# Patient Record
Sex: Male | Born: 1985 | Race: White | Hispanic: No | Marital: Married | Smoking: Never smoker
Health system: Southern US, Community
[De-identification: ages and names within clinical notes are randomized; demographics above are authoritative.]

## PROBLEM LIST (undated history)

## (undated) DIAGNOSIS — C9591 Leukemia, unspecified, in remission: Secondary | ICD-10-CM

## (undated) DIAGNOSIS — G4730 Sleep apnea, unspecified: Secondary | ICD-10-CM

## (undated) DIAGNOSIS — F909 Attention-deficit hyperactivity disorder, unspecified type: Secondary | ICD-10-CM

## (undated) HISTORY — PX: JOINT REPLACEMENT: SHX530

## (undated) HISTORY — PX: BONE GRAFT HIP ILIAC CREST: SUR159

## (undated) HISTORY — DX: Leukemia, unspecified, in remission: C95.91

## (undated) HISTORY — DX: Sleep apnea, unspecified: G47.30

---
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
## 2008-02-29 ENCOUNTER — Emergency Department (HOSPITAL_COMMUNITY): Admission: EM | Admit: 2008-02-29 | Discharge: 2008-02-29 | Payer: Self-pay | Admitting: Emergency Medicine

## 2010-04-15 ENCOUNTER — Encounter: Payer: Self-pay | Admitting: Family Medicine

## 2010-07-06 ENCOUNTER — Ambulatory Visit
Admission: RE | Admit: 2010-07-06 | Discharge: 2010-07-06 | Disposition: A | Discharge status: Home / Self Care | Payer: BC Managed Care – PPO | Source: Ambulatory Visit | Attending: Family Medicine | Admitting: Family Medicine

## 2010-07-06 ENCOUNTER — Other Ambulatory Visit: Payer: Self-pay | Admitting: Family Medicine

## 2010-07-06 DIAGNOSIS — M25551 Pain in right hip: Secondary | ICD-10-CM

## 2010-08-10 NOTE — Procedures (Signed)
NAME:  Patrick Cohen, Patrick Cohen             ACCOUNT NO.:  [id number]   MEDICAL RECORD NO.:  [id number]          PATIENT TYPE:  OUT   LOCATION:  SLEEP CENTER                 FACILITY:  [facility]   PHYSICIAN:  Clinton D. Maple Hudson, M.D. DATE OF BIRTH:  [birth year]   DATE OF ADMISSION:  09/27/2003  DATE OF DISCHARGE:  09/27/2003                            MULTIPLE SLEEP LATENCY TEST   REFERRING PHYSICIAN:  Dr. Jetty Duhamel.   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  NPSG on  September 27, 2003 recorded an RDI of 15 obstructive events per hour consistent  with mild to moderate obstructive sleep apnea.  Sleep architecture was  significant for 321 minutes of sleep, including 9% REM and a sleep  efficiency of 66%.  A sleep latency of 146 minutes raised possibility of  delayed sleep phase syndrome.  Epworth sleepiness score prior to the NPSG  was 19/24 and no medications were taken during the study day of the MSLT.   MEDICATIONS:  Wellbutrin which he had not taken in a month, and Strattera  last taken on May 30.   SLEEP ARCHITECTURE:  Five naps were recorded at 2 hour intervals, beginning  at 8 a.m.  Nap Times   Sleep Latency                             REM Latency  1.  8am                       5 mins.                                   0  2.  10am                      1.5 mins.                           0  3.  12pm                      .                                   0  4.  2pm                       20 mins.                                  0  5.  4pm                       15.72mins.  0   Mean Sleep Latency:  10.6  minutes, no REM was recorded..   IMPRESSION/RECOMMENDATION:  Mild nonspecific somnolence, within normal for a  young adult, noting a preponderance of sleepiness on the 8 a.m. and 10 a.m.  naps.  This may be consistent with his recorded sleep problems from the  NPSG.  There is  no evidence to suggest narcolepsy.                                   ______________________________                                Rennis Chris Maple Hudson, M.D.                                Diplomate, American Board of Sleep Medicine    CDY/MEDQ  D:  10/09/2003 10:13:03  T:  10/09/2003 20:26:13  Job:  [id number]

## 2010-08-10 NOTE — Procedures (Signed)
NAME:  Patrick Cohen, Patrick Cohen             ACCOUNT NO.:  [id number]   MEDICAL RECORD NO.:  [id number]          PATIENT TYPE:  OUT   LOCATION:  SLEEP CENTER                 FACILITY:  [facility]   PHYSICIAN:  Clinton D. Maple Hudson, M.D. DATE OF BIRTH:  03-07-1986   DATE OF ADMISSION:  09/27/2003  DATE OF DISCHARGE:  09/27/2003                              NOCTURNAL POLYSOMNOGRAM   REFERRING PHYSICIAN:  Dr. Jetty Duhamel   INDICATION FOR STUDY/HISTORY:  24 year old male complaining of  insomnia/sleep apnea.  Difficulty initiating sleep, excessive daytime  sleepiness, frequent arousals.  BMI 23, weight 147 pounds, Epworth score  16/24, neck size 15 inches.  No medication was taken before this sleep study  and the patient has not taken Wellbutrin in the month prior to this study.   MEDICATIONS:  Wellbutrin and Strattera.   SLEEP ARCHITECTURE:  After lights out at 9:56 p.m. sleep onset was at 12:23  a.m., awakening at 6 a.m. for a total sleep time of 321.5 minutes.  Sleep  efficiency was 95% once sleep was achieved but sleep efficiency for  recording time was 66%.  Stage 1 2%, stage 2 80%, stage 3 and 4 8%, REM was  9%.  Sleep latency 146 minutes, REM latency 130 minutes.  Arousal index was  4.9 per hour.  Most sleep was supine or on right side.   RESPIRATORY DATA:  Following NPS protocol, mild obstructive sleep  apnea/hypopnea was recorded with an RDI of 15.5 obstructive events per hour.  This included 48 obstructive hypopneas, 35 obstructive apneas.  Events were  recorded while supine and sleeping on the right side.   OXYGEN DATA:  Snoring was extremely loud.  Mean oxygen saturation through  the study was 95-96% with a nadir to 83% recorded during REM, briefly.   CARDIAC DATA:  Normal sinus rhythm with occasional PACs.  Movement/parasomnia:  Occasional limb jerks were not associated with  arousals.  PLM index was 0 per hour.  There were no bathroom trips.   IMPRESSION/RECOMMENDATION:  1. Somewhat  delayed sleep onset might suggest a component of delayed sleep     phase syndrome in this age range.  2. Mild obstructive sleep apnea/hypopnea with mild oxygen desaturation and     loud snoring.  3. See the MSLT report.                                   ______________________________                                Rennis Chris. Maple Hudson, M.D.                                Diplomate, American Board of Sleep Medicine    CDY/MEDQ  D:  10/09/2003 10:03:03  T:  10/09/2003 20:15:46  Job:  [id number]

## 2010-10-02 ENCOUNTER — Other Ambulatory Visit: Payer: Self-pay | Admitting: Family Medicine

## 2010-10-02 DIAGNOSIS — M25551 Pain in right hip: Secondary | ICD-10-CM

## 2010-10-06 ENCOUNTER — Ambulatory Visit
Admission: RE | Admit: 2010-10-06 | Discharge: 2010-10-06 | Disposition: A | Discharge status: Home / Self Care | Payer: BC Managed Care – PPO | Source: Ambulatory Visit | Attending: Family Medicine | Admitting: Family Medicine

## 2010-10-06 DIAGNOSIS — M25551 Pain in right hip: Secondary | ICD-10-CM

## 2010-10-06 MED ORDER — GADOBENATE DIMEGLUMINE 529 MG/ML IV SOLN
13.0000 mL | Freq: Once | INTRAVENOUS | Status: AC | PRN
  Administered 2010-10-06: 13 mL via INTRAVENOUS

## 2010-12-28 LAB — CBC
HCT: 26.4 % — ABNORMAL LOW (ref 39.0–52.0)
Hemoglobin: 9 g/dL — ABNORMAL LOW (ref 13.0–17.0)
MCHC: 34.2 g/dL (ref 30.0–36.0)
MCV: 93.9 fL (ref 78.0–100.0)
Platelets: 216 10*3/uL (ref 150–400)
RBC: 2.82 MIL/uL — ABNORMAL LOW (ref 4.22–5.81)
RDW: 15.7 % — ABNORMAL HIGH (ref 11.5–15.5)
WBC: 2.6 10*3/uL — ABNORMAL LOW (ref 4.0–10.5)

## 2010-12-28 LAB — APTT: aPTT: 27 seconds (ref 24–37)

## 2010-12-28 LAB — DIFFERENTIAL
Lymphocytes Relative: 55 % — ABNORMAL HIGH (ref 12–46)
Lymphs Abs: 1.4 10*3/uL (ref 0.7–4.0)
Monocytes Relative: 10 % (ref 3–12)
Neutro Abs: 0.9 10*3/uL — ABNORMAL LOW (ref 1.7–7.7)
Neutrophils Relative %: 34 % — ABNORMAL LOW (ref 43–77)

## 2010-12-28 LAB — PROTIME-INR: INR: 1.2 (ref 0.00–1.49)

## 2011-08-16 ENCOUNTER — Ambulatory Visit
Admission: RE | Admit: 2011-08-16 | Discharge: 2011-08-16 | Disposition: A | Discharge status: Home / Self Care | Payer: Self-pay | Source: Ambulatory Visit | Attending: Family Medicine | Admitting: Family Medicine

## 2011-08-16 ENCOUNTER — Other Ambulatory Visit: Payer: Self-pay | Admitting: Family Medicine

## 2011-08-16 DIAGNOSIS — M25519 Pain in unspecified shoulder: Secondary | ICD-10-CM

## 2011-08-16 DIAGNOSIS — M8700 Idiopathic aseptic necrosis of unspecified bone: Secondary | ICD-10-CM

## 2011-10-11 ENCOUNTER — Ambulatory Visit
Admission: RE | Admit: 2011-10-11 | Discharge: 2011-10-11 | Disposition: A | Discharge status: Home / Self Care | Payer: BC Managed Care – PPO | Source: Ambulatory Visit | Attending: Family Medicine | Admitting: Family Medicine

## 2011-10-11 ENCOUNTER — Other Ambulatory Visit: Payer: Self-pay | Admitting: Family Medicine

## 2011-10-11 DIAGNOSIS — Z9889 Other specified postprocedural states: Secondary | ICD-10-CM

## 2011-10-11 DIAGNOSIS — R52 Pain, unspecified: Secondary | ICD-10-CM

## 2012-03-30 ENCOUNTER — Telehealth: Payer: Self-pay | Admitting: Internal Medicine

## 2012-03-30 NOTE — Telephone Encounter (Signed)
I spoke with pt father and he states the pt saw [facility] several years back and had a sleep study done but did not want a cpap at that time since he was so young and active. Pt father states since then the pt was diagnosed with leukemia. He is in remission but is still having a lot of fatigue so they wanted to try cpap to see if this helped. I checked with rhonda who called [facility] and they will require a face-to-face with the cpap order so I advised the pt father they will need to wait until appt in 04-2012 and order can be placed at that time. Carron Curie, CMA

## 2012-04-27 ENCOUNTER — Ambulatory Visit (INDEPENDENT_AMBULATORY_CARE_PROVIDER_SITE_OTHER): Payer: BC Managed Care – PPO | Admitting: Internal Medicine

## 2012-04-27 ENCOUNTER — Encounter: Payer: Self-pay | Admitting: Internal Medicine

## 2012-04-27 VITALS — BP 108/60 | HR 64 | Ht 67.0 in | Wt 147.2 lb

## 2012-04-27 DIAGNOSIS — C9101 Acute lymphoblastic leukemia, in remission: Secondary | ICD-10-CM

## 2012-04-27 DIAGNOSIS — G4733 Obstructive sleep apnea (adult) (pediatric): Secondary | ICD-10-CM

## 2012-04-27 NOTE — Patient Instructions (Addendum)
Order- new CPAP autotitrate 5-20 cwp, mask of choice, humidifier supplies    Dx OSA   Please call as needd

## 2012-04-27 NOTE — Progress Notes (Signed)
04/27/12- 26 yoM nonsmoker, Former patient- last sleep study 2005; attached to consult sheet; not on CPAP  Diagnosed with sleep apnea while in high school. Currently takes Ambien to consolidate sleep but still feels tired in the morning after 9 hours of sleep. Without Ambien sleep latency is 2 hours. Sleeping alone, does not know if he snores, but did snore very loudly at the time of his sleep study. NPSG 09/27/03- AHI 15.5/ hr, MSLT 09/27/03 wnl- mean latency 10.6 min.Weight was 147 lbs. He tried CPAP only a few weeks but couldn't sleep with it at that time. Denies nasal congestion or history of ENT surgery. 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 He is unmarried, working as a Optician, dispensing and living with friends.  Prior to Admission medications   Medication Sig Start Date End Date Taking? Authorizing Provider  zolpidem (AMBIEN) 10 MG tablet Take 1 tablet by mouth At bedtime as needed. 03/31/12  Yes Historical Provider, MD   Past Medical History  Diagnosis Date  . 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 Sleep apnea    Past Surgical History  Procedure Laterality Date  . Bone graft hip iliac crest      right hip   Family History  Problem Relation Age of Onset  . Asthma Father     non smoker  . Sleep apnea Father   . Healthy Mother   . Healthy Sister    History   Social History  . Marital Status: Married    Spouse Name: N/A    Number of Children: 0  . Years of Education: N/A   Occupational History  . minister   .     Social History Main Topics  . Smoking status: Never Smoker   . Smokeless tobacco: Not on file  . Alcohol Use: Yes     Comment: 1-2 a week(red wine)  . Drug Use: No  . Sexually Active: Not on file   Other Topics Concern  . Not on file   Social History Narrative  . No narrative on file   ROS-see HPI Constitutional:   No-   weight loss,  night sweats, fevers, chills, +fatigue, lassitude. HEENT:   +  Headaches,no- difficulty swallowing, tooth/dental problems, sore throat,       No-  sneezing, itching, ear ache, nasal congestion, post nasal drip,  CV:  No-   chest pain, orthopnea, PND, swelling in lower extremities, anasarca,                                  dizziness, palpitations Resp: No-   shortness of breath with exertion or at rest.              No-   productive cough,  No non-productive cough,  No- coughing up of blood.              No-   change in color of mucus.  No- wheezing.   Skin: No-   rash or lesions. GI:  No-   heartburn, indigestion, abdominal pain, nausea, vomiting, diarrhea,                 change in bowel habits, loss of appetite GU: No-   dysuria, change in color of urine, no urgency or frequency.  No- flank pain. MS:  No-  joint pain or swelling.  No- decreased range of motion.  No- back pain. Neuro-     nothing unusual Psych:  No- change in mood or affect. No depression or anxiety.  No memory loss.  OBJ- Physical Exam General- Alert, Oriented, Affect-appropriate, Distress- none acute, but looks tired. Trim. Skin- rash-none, lesions- none, excoriation- none Lymphadenopathy- none Head- atraumatic            Eyes- Gross vision intact, PERRLA, conjunctivae and secretions clear            Ears- Hearing, canals-normal            Nose- Clear, no-Septal dev, mucus, polyps, erosion, perforation             Throat- Mallampati II-III , mucosa clear , drainage- none, +tonsils present Neck- flexible , trachea midline, no stridor , thyroid nl, carotid no bruit Chest - symmetrical excursion , unlabored           Heart/CV- RRR , no murmur , no gallop  , no rub, nl s1 s2                           - JVD- none , edema- none, stasis changes- none, varices- none           Lung- clear to P&A, wheeze- none, cough- none , dullness-none, rub- none           Chest wall-  Abd- tender-no, distended-no, bowel sounds-present,  HSM- no Br/ Gen/ Rectal- Not done, not indicated Extrem- cyanosis- none, clubbing, none, atrophy- none, strength- nl Neuro- grossly intact to observation

## 2012-05-05 ENCOUNTER — Telehealth: Payer: Self-pay | Admitting: Internal Medicine

## 2012-05-05 DIAGNOSIS — G4733 Obstructive sleep apnea (adult) (pediatric): Secondary | ICD-10-CM | POA: Insufficient documentation

## 2012-05-05 DIAGNOSIS — C9101 Acute lymphoblastic leukemia, in remission: Secondary | ICD-10-CM | POA: Insufficient documentation

## 2012-05-05 NOTE — Telephone Encounter (Signed)
Called and spoke with [facility] and order has been received. Candice or Verlon Au will contact patient to arrange set up. Roderic Palau Cobb]

## 2012-05-05 NOTE — Telephone Encounter (Signed)
General 04/27/2012 2:41 PM COBB, RHONDA J        Note    Order faxed to [facility] ([phone]) for New cpap autotitrate Range 5-20 cwp, mask of choice, humidifier, supplies. Contact patient on mobile number to arrange. Rhonda J Cobb   Please advise PCC's thanks

## 2012-05-05 NOTE — Telephone Encounter (Signed)
Called and spoke with Verlon Au at [employer], she asked that I re-fax the order to her attention. I checked the fax number where I originally faxed order to as [phone]. Verlon Au confirmed that was the correct fax #. Confirmation was received from the last fax on 04/27/12, however, the information has been re-faxed. Asked her to please process this ASAP as patient has already waited a week. She stated she would process ASAP. Rhonda J Cobb

## 2012-05-05 NOTE — Telephone Encounter (Signed)
Called and left a message on pt's phone. That order was faxed to [facility] on 04/27/12, confirmation received. Advised patient that I re-faxed order to Leslie's attention at [facility] and asked that she contact patient ASAP. Asked patient that if he had any questions, or if he hasn't heard from [facility] by Friday 05/08/12, to return my call to [phone]. Rhonda J Cobb

## 2012-05-05 NOTE — Assessment & Plan Note (Addendum)
We will reviewed the basics of sleep apnea and treatment options. He wants to retry CPAP for particular symptom of daytime sleepiness. Plan-auto titrate CPAP for pressure recommendation

## 2012-05-05 NOTE — Telephone Encounter (Signed)
Called and spoke with patient and he is aware that Candice or Bettey Mare will contact him to arrange cpap set up. Advised pt that if he hasn't heard from them by 05/12/12 to call me back. Rhonda J Cobb

## 2012-05-14 ENCOUNTER — Telehealth: Payer: Self-pay | Admitting: Internal Medicine

## 2012-05-14 NOTE — Telephone Encounter (Signed)
Patient is wanting to switch DME companies for CPAP machine--current [facility]  $600.00 copay for machine $99 month until meet deductible ( to have machine) $20 month once deductible has been met.  Patient feels this is unreasonable and would like to switch to someone in [place] that will be more cost efficient.   Libby please advise what we can do for this patient. Thanks.

## 2012-05-15 NOTE — Telephone Encounter (Signed)
refaxed order to [facility] to get new cpap Tobe Sos

## 2012-05-21 ENCOUNTER — Encounter: Payer: Self-pay | Admitting: Internal Medicine

## 2012-06-05 ENCOUNTER — Encounter: Payer: Self-pay | Admitting: Internal Medicine

## 2012-06-05 ENCOUNTER — Ambulatory Visit (INDEPENDENT_AMBULATORY_CARE_PROVIDER_SITE_OTHER): Payer: BC Managed Care – PPO | Admitting: Internal Medicine

## 2012-06-05 VITALS — BP 110/66 | HR 94 | Ht 67.0 in | Wt 148.4 lb

## 2012-06-05 DIAGNOSIS — G4733 Obstructive sleep apnea (adult) (pediatric): Secondary | ICD-10-CM

## 2012-06-05 NOTE — Progress Notes (Signed)
04/27/12- 26 yoM nonsmoker, Former patient- last sleep study 2005; attached to consult sheet; not on CPAP  Diagnosed with sleep apnea while in high school. Currently takes Ambien to consolidate sleep but still feels tired in the morning after 9 hours of sleep. Without Ambien sleep latency is 2 hours. Sleeping alone, does not know if he snores, but did snore very loudly at the time of his sleep study. NPSG 09/27/03- AHI 15.5/ hr, MSLT 09/27/03 wnl- mean latency 10.6 min.Weight was 147 lbs. He tried CPAP only a few weeks but couldn't sleep with it at that time. Denies nasal congestion or history of ENT surgery. 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 He is unmarried, working as a Optician, dispensing and living with friends.  06/05/12-  26 yoM nonsmoker followed for obstructive sleep apnea with an insomnia, complicated by ALL in remission FOLLOWS FOR: got set up with [facility] for CPAP- review download with patient. Is trying to CPAP with nasal pillows mask. Finds it uncomfortable. Has had a bad cold for 2 weeks which added to his discomfort. Ambien 10 mg works well. 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  ROS-see HPI Constitutional:   No-   weight loss, night sweats, fevers, chills, +fatigue, lassitude. HEENT:   +  Headaches,no- difficulty swallowing, tooth/dental problems, sore throat,       No-  sneezing, itching, ear ache, +nasal congestion, post nasal drip,  CV:  No-   chest pain, orthopnea, PND, swelling in lower extremities, anasarca,                                  dizziness, palpitations Resp: No-   shortness of breath with exertion or at rest.              No-   productive cough,  No non-productive cough,  No- coughing up of blood.              No-   change in color of mucus.  No- wheezing.   Skin: No-   rash or lesions. GI:  No-   heartburn, indigestion, abdominal pain, nausea,  vomiting,  GU:  MS:  No-   joint pain or swelling. Neuro-     nothing unusual Psych:  No- change in mood or affect. No depression or anxiety.  No memory loss.  OBJ- Physical Exam General- Alert, Oriented, Affect-appropriate, Distress- none acute, but looks tired. Trim. Skin- rash-none, lesions- none, excoriation- none Lymphadenopathy- none Head- atraumatic            Eyes- Gross vision intact, PERRLA, conjunctivae and secretions clear            Ears- Hearing, canals-normal            Nose- Clear, no-Septal dev, mucus, polyps, erosion, perforation             Throat- Mallampati II-III , mucosa clear , drainage- none, +tonsils present Neck- flexible , trachea midline, no stridor , thyroid nl, carotid no bruit Chest - symmetrical excursion , unlabored           Heart/CV- RRR , no murmur , no gallop  , no rub, nl s1 s2                           - JVD- none ,  edema- none, stasis changes- none, varices- none           Lung- clear to P&A, wheeze- none, cough- none , dullness-none, rub- none           Chest wall-  Abd-  Br/ Gen/ Rectal- Not done, not indicated Extrem- cyanosis- none, clubbing, none, atrophy- none, strength- nl Neuro- grossly intact to observation

## 2012-06-05 NOTE — Patient Instructions (Addendum)
When the download summary comes to [place] from [facility], I will order a fixed pressure. If that is not comfortable, let me know.

## 2012-06-10 NOTE — Assessment & Plan Note (Signed)
We're working to get his download from [facility], then anticipate change to fixed CPAP. We may need to AutoPap for comfort but we discussed use of the ramp feature.

## 2012-07-14 ENCOUNTER — Encounter: Payer: Self-pay | Admitting: Internal Medicine

## 2012-07-14 ENCOUNTER — Ambulatory Visit (INDEPENDENT_AMBULATORY_CARE_PROVIDER_SITE_OTHER): Payer: BC Managed Care – PPO | Admitting: Internal Medicine

## 2012-07-14 VITALS — BP 102/78 | HR 102 | Ht 67.5 in | Wt 144.6 lb

## 2012-07-14 DIAGNOSIS — G4733 Obstructive sleep apnea (adult) (pediatric): Secondary | ICD-10-CM

## 2012-07-14 NOTE — Patient Instructions (Addendum)
We will get download result from [facility] today so I can try a fixed pressure setting for your CPAP instead of the AutoPAP

## 2012-07-14 NOTE — Progress Notes (Signed)
04/27/12- 26 yoM nonsmoker, Former patient- last sleep study 2005; attached to consult sheet; not on CPAP  Diagnosed with sleep apnea while in high school. Currently takes Ambien to consolidate sleep but still feels tired in the morning after 9 hours of sleep. Without Ambien sleep latency is 2 hours. Sleeping alone, does not know if he snores, but did snore very loudly at the time of his sleep study. NPSG 09/27/03- AHI 15.5/ hr, MSLT 09/27/03 wnl- mean latency 10.6 min.Weight was 147 lbs. He tried CPAP only a few weeks but couldn't sleep with it at that time. Denies nasal congestion or history of ENT surgery. 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 He is unmarried, working as a Optician, dispensing and living with friends.  06/05/12-  26 yoM nonsmoker followed for obstructive sleep apnea with an insomnia, complicated by ALL in remission FOLLOWS FOR: got set up with [facility] for CPAP- review download with patient. Is trying to CPAP with nasal pillows mask. Finds it uncomfortable. Has had a bad cold for 2 weeks which added to his discomfort. Ambien 10 mg works well. 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  07/14/12- 27 yoM nonsmoker followed for obstructive sleep apnea with an insomnia, complicated by ALL in remission FOLLOWS FOR:  Pt. states he's wearing his CPAP AutoPAP/ [facility] every night for 3 hours, wakes up with nasal mask off. His download documented good control but he needs to wear longer each night. He continues Ambien still pulls his mask off in his sleep. He is not sleepwalking. He does not recognize that the mask is uncomfortable.  ROS-see HPI Constitutional:   No-   weight loss, night sweats, fevers, chills, +fatigue, lassitude. HEENT:  No- Headaches,no- difficulty swallowing, tooth/dental problems, sore throat,       No-  sneezing, itching, ear ache, +nasal congestion, post nasal  drip,  CV:  No-   chest pain, orthopnea, PND, swelling in lower extremities, anasarca,                                  dizziness, palpitations Resp: No-   shortness of breath with exertion or at rest.              No-   productive cough,  No non-productive cough,  No- coughing up of blood.              No-   change in color of mucus.  No- wheezing.   Skin: No-   rash or lesions. GI:  No-   heartburn, indigestion, abdominal pain, nausea, vomiting,  GU:  MS:  No-   joint pain or swelling. Neuro-     nothing unusual Psych:  No- change in mood or affect. No depression or anxiety.  No memory loss.  OBJ- Physical Exam General- Alert, Oriented, Affect-appropriate, Distress- none acute. Trim. Skin- rash-none, lesions- none, excoriation- none Lymphadenopathy- none Head- atraumatic            Eyes- Gross vision intact, PERRLA, conjunctivae and secretions clear            Ears- Hearing, canals-normal            Nose- Clear, no-Septal dev, mucus, polyps, erosion, perforation             Throat- Mallampati II-III , mucosa clear , drainage- none, +tonsils present Neck-  flexible , trachea midline, no stridor , thyroid nl, carotid no bruit Chest - symmetrical excursion , unlabored           Heart/CV- RRR , no murmur , no gallop  , no rub, nl s1 s2                           - JVD- none , edema- none, stasis changes- none, varices- none           Lung- clear to P&A, wheeze- none, cough- none , dullness-none, rub- none           Chest wall-  Abd-  Br/ Gen/ Rectal- Not done, not indicated Extrem- cyanosis- none, clubbing, none, atrophy- none, strength- nl Neuro- grossly intact to observation

## 2012-07-21 NOTE — Assessment & Plan Note (Signed)
He is pulling mask of sleep. I've asked him to pay attention to whether it really is in somewhat uncomfortable. We discussed sedating medications but don't want to depend on them. We discussed CPAP compliance and we have begun a discussion of alternatives including mouthpieces.

## 2012-11-13 ENCOUNTER — Ambulatory Visit: Payer: BC Managed Care – PPO | Admitting: Internal Medicine

## 2012-11-17 ENCOUNTER — Ambulatory Visit (INDEPENDENT_AMBULATORY_CARE_PROVIDER_SITE_OTHER): Payer: BC Managed Care – PPO | Admitting: Internal Medicine

## 2012-11-17 ENCOUNTER — Encounter: Payer: Self-pay | Admitting: Internal Medicine

## 2012-11-17 VITALS — BP 108/60 | HR 97 | Ht 67.0 in | Wt 140.0 lb

## 2012-11-17 DIAGNOSIS — G4733 Obstructive sleep apnea (adult) (pediatric): Secondary | ICD-10-CM

## 2012-11-17 NOTE — Patient Instructions (Addendum)
Order- DME [facility] change CPAP to fixed pressure 8, with replacement mask of choice and supplies    Dx OSA  Please call as needed

## 2012-11-17 NOTE — Progress Notes (Signed)
04/27/12- 26 yoM nonsmoker, Former patient- last sleep study 2005; attached to consult sheet; not on CPAP  Diagnosed with sleep apnea while in high school. Currently takes Ambien to consolidate sleep but still feels tired in the morning after 9 hours of sleep. Without Ambien sleep latency is 2 hours. Sleeping alone, does not know if he snores, but did snore very loudly at the time of his sleep study. NPSG 09/27/03- AHI 15.5/ hr, MSLT 09/27/03 wnl- mean latency 10.6 min.Weight was 147 lbs. He tried CPAP only a few weeks but couldn't sleep with it at that time. Denies nasal congestion or history of ENT surgery. 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 He is unmarried, working as a Optician, dispensing and living with friends.  06/05/12-  26 yoM nonsmoker followed for obstructive sleep apnea with an insomnia, complicated by ALL in remission FOLLOWS FOR: got set up with [facility] for CPAP- review download with patient. Is trying to CPAP with nasal pillows mask. Finds it uncomfortable. Has had a bad cold for 2 weeks which added to his discomfort. Ambien 10 mg works well. 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  07/14/12- 27 yoM nonsmoker followed for obstructive sleep apnea with an insomnia, complicated by ALL in remission FOLLOWS FOR:  Pt. states he's wearing his CPAP AutoPAP/ [facility] every night for 3 hours, wakes up with nasal mask off. His download documented good control but he needs to wear longer each night. He continues Ambien still pulls his mask off in his sleep. He is not sleepwalking. He does not recognize that the mask is uncomfortable.  11/17/12- 27 yoM nonsmoker followed for obstructive sleep apnea with an insomnia, complicated by ALL in remission FOLLOWS FOR:Has been sick the past week so unable to wear the CPAP but most every night patient is wearing CPAP auto/ [facility]. This download  indicated inadequate over the last month-discussed. He says he moves around a lot at night and the nasal pillows pull out.  ROS-see HPI Constitutional:   No-   weight loss, night sweats, fevers, chills, +fatigue, lassitude. HEENT:  No- Headaches,no- difficulty swallowing, tooth/dental problems, sore throat,       No-  sneezing, itching, ear ache, +nasal congestion, post nasal drip,  CV:  No-   chest pain, orthopnea, PND, swelling in lower extremities, anasarca, dizziness, palpitations Resp: No-   shortness of breath with exertion or at rest.              No-   productive cough,  No non-productive cough,  No- coughing up of blood.              No-   change in color of mucus.  No- wheezing.   Skin: No-   rash or lesions. GI:  No-   heartburn, indigestion, abdominal pain, nausea, vomiting,  GU:  MS:  No-   joint pain or swelling. Neuro-     nothing unusual Psych:  No- change in mood or affect. No depression or anxiety.  No memory loss.  OBJ- Physical Exam General- Alert, Oriented, Affect-appropriate, Distress- none acute. Trim. Skin- rash-none, lesions- none, excoriation- none Lymphadenopathy- none Head- atraumatic            Eyes- Gross vision intact, PERRLA, conjunctivae and secretions clear            Ears- Hearing, canals-normal            Nose-  Clear, no-Septal dev, mucus, polyps, erosion, perforation             Throat- Mallampati II-III , mucosa clear , drainage- none, +tonsils present Neck- flexible , trachea midline, no stridor , thyroid nl, carotid no bruit Chest - symmetrical excursion , unlabored           Heart/CV- RRR , no murmur , no gallop  , no rub, nl s1 s2                           - JVD- none , edema- none, stasis changes- none, varices- none           Lung- clear to P&A, wheeze- none, cough- none , dullness-none, rub- none           Chest wall-  Abd-  Br/ Gen/ Rectal- Not done, not indicated Extrem- cyanosis- none, clubbing, none, atrophy- none, strength-  nl Neuro- grossly intact to observation

## 2012-11-29 NOTE — Assessment & Plan Note (Signed)
We discussed use of CPAP and comfort measures. He may end up better with an oral appliance. Plan-replacement mask of choice, change pressure to fixed 8

## 2013-04-07 ENCOUNTER — Ambulatory Visit
Admission: RE | Admit: 2013-04-07 | Discharge: 2013-04-07 | Disposition: A | Discharge status: Home / Self Care | Payer: BC Managed Care – PPO | Source: Ambulatory Visit | Attending: Family Medicine | Admitting: Family Medicine

## 2013-04-07 ENCOUNTER — Other Ambulatory Visit: Payer: Self-pay | Admitting: Family Medicine

## 2013-04-07 DIAGNOSIS — M25551 Pain in right hip: Secondary | ICD-10-CM

## 2013-04-07 DIAGNOSIS — M25552 Pain in left hip: Principal | ICD-10-CM

## 2013-05-21 ENCOUNTER — Ambulatory Visit: Payer: BC Managed Care – PPO | Admitting: Internal Medicine

## 2014-08-15 ENCOUNTER — Ambulatory Visit
Admission: RE | Admit: 2014-08-15 | Discharge: 2014-08-15 | Disposition: A | Discharge status: Home / Self Care | Payer: PRIVATE HEALTH INSURANCE | Source: Ambulatory Visit | Attending: Family Medicine | Admitting: Family Medicine

## 2014-08-15 ENCOUNTER — Other Ambulatory Visit: Payer: Self-pay | Admitting: Family Medicine

## 2014-08-15 DIAGNOSIS — M79642 Pain in left hand: Principal | ICD-10-CM

## 2014-08-15 DIAGNOSIS — M79641 Pain in right hand: Secondary | ICD-10-CM

## 2014-09-21 ENCOUNTER — Other Ambulatory Visit: Payer: Self-pay | Admitting: Orthopedic Surgery

## 2014-10-03 ENCOUNTER — Encounter (HOSPITAL_COMMUNITY)
Admission: RE | Admit: 2014-10-03 | Discharge: 2014-10-03 | Disposition: A | Discharge status: Home / Self Care | Payer: PRIVATE HEALTH INSURANCE | Source: Ambulatory Visit | Attending: Orthopedic Surgery | Admitting: Orthopedic Surgery

## 2014-10-03 ENCOUNTER — Other Ambulatory Visit (HOSPITAL_COMMUNITY): Payer: Self-pay | Admitting: *Deleted

## 2014-10-03 ENCOUNTER — Encounter (HOSPITAL_COMMUNITY): Payer: Self-pay

## 2014-10-03 DIAGNOSIS — Z01818 Encounter for other preprocedural examination: Secondary | ICD-10-CM

## 2014-10-03 HISTORY — DX: Attention-deficit hyperactivity disorder, unspecified type: F90.9

## 2014-10-03 LAB — COMPREHENSIVE METABOLIC PANEL
ALBUMIN: 4.1 g/dL (ref 3.5–5.0)
ALK PHOS: 50 U/L (ref 38–126)
ALT: 12 U/L — AB (ref 17–63)
ANION GAP: 5 (ref 5–15)
AST: 15 U/L (ref 15–41)
BILIRUBIN TOTAL: 0.7 mg/dL (ref 0.3–1.2)
BUN: 11 mg/dL (ref 6–20)
CO2: 28 mmol/L (ref 22–32)
Calcium: 9.3 mg/dL (ref 8.9–10.3)
Chloride: 107 mmol/L (ref 101–111)
Creatinine, Ser: 0.88 mg/dL (ref 0.61–1.24)
GFR calc Af Amer: >60 mL/min (ref >60)
GFR calc non Af Amer: >60 mL/min (ref >60)
GLUCOSE: 80 mg/dL (ref 65–99)
Potassium: 4.3 mmol/L (ref 3.5–5.1)
Sodium: 140 mmol/L (ref 135–145)
Total Protein: 7.2 g/dL (ref 6.5–8.1)

## 2014-10-03 LAB — CBC WITH DIFFERENTIAL/PLATELET
BASOS ABS: 0 10*3/uL (ref 0.0–0.1)
BASOS PCT: 1 % (ref 0–1)
Eosinophils Absolute: 0.1 10*3/uL (ref 0.0–0.7)
Eosinophils Relative: 2 % (ref 0–5)
HCT: 43.7 % (ref 39.0–52.0)
Hemoglobin: 16 g/dL (ref 13.0–17.0)
Lymphocytes Relative: 41 % (ref 12–46)
Lymphs Abs: 1.9 10*3/uL (ref 0.7–4.0)
MCH: 32.3 pg (ref 26.0–34.0)
MCHC: 36.6 g/dL — ABNORMAL HIGH (ref 30.0–36.0)
MCV: 88.1 fL (ref 78.0–100.0)
MONO ABS: 0.4 10*3/uL (ref 0.1–1.0)
Monocytes Relative: 8 % (ref 3–12)
Neutro Abs: 2.3 10*3/uL (ref 1.7–7.7)
Neutrophils Relative %: 48 % (ref 43–77)
Platelets: 175 10*3/uL (ref 150–400)
RBC: 4.96 MIL/uL (ref 4.22–5.81)
RDW: 12.2 % (ref 11.5–15.5)
WBC: 4.7 10*3/uL (ref 4.0–10.5)

## 2014-10-03 LAB — URINALYSIS, ROUTINE W REFLEX MICROSCOPIC
BILIRUBIN URINE: NEGATIVE
Glucose, UA: NEGATIVE mg/dL
Hgb urine dipstick: NEGATIVE
Ketones, ur: NEGATIVE mg/dL
LEUKOCYTES UA: NEGATIVE
Nitrite: NEGATIVE
PROTEIN: NEGATIVE mg/dL
Specific Gravity, Urine: 1.021 (ref 1.005–1.030)
Urobilinogen, UA: 0.2 mg/dL (ref 0.0–1.0)
pH: 5 (ref 5.0–8.0)

## 2014-10-03 LAB — PROTIME-INR
INR: 1.24 (ref 0.00–1.49)
Prothrombin Time: 15.7 seconds — ABNORMAL HIGH (ref 11.6–15.2)

## 2014-10-03 LAB — SURGICAL PCR SCREEN
MRSA, PCR: NEGATIVE
STAPHYLOCOCCUS AUREUS: POSITIVE — AB

## 2014-10-03 LAB — APTT: aPTT: 29 seconds (ref 24–37)

## 2014-10-03 NOTE — Pre-Procedure Instructions (Signed)
    Patrick Cohen  10/03/2014      [facility] [address] [place] [address] Phone: [phone] Fax: [phone]  [id number] [name], [place] - [address] AT [name] [address] Phone: [phone] Fax: [phone]    Your procedure is scheduled on 10/06/14.  Report to [facility] Admitting at 530 A.M.  Call this number if you have problems the morning of surgery:  [phone]   Remember:  Do not eat food or drink liquids after midnight.  Take these medicines the morning of surgery with A SIP OF WATER - none   Do not wear jewelry, make-up or nail polish.  Do not wear lotions, powders, or perfumes.  You may wear deodorant.  Do not shave 48 hours prior to surgery.  Men may shave face and neck.  Do not bring valuables to the hospital.  [facility] is not responsible for any belongings or valuables.  Contacts, dentures or bridgework may not be worn into surgery.  Leave your suitcase in the car.  After surgery it may be brought to your room.  For patients admitted to the hospital, discharge time will be determined by your treatment team.  Patients discharged the day of surgery will not be allowed to drive home. Name and phone number of your driver:   Special instructions:    Please read over the following fact sheets that you were given. Pain Booklet, Coughing and Deep Breathing, MRSA Information and Surgical Site Infection Prevention

## 2014-10-03 NOTE — Pre-Procedure Instructions (Signed)
    Patrick Cohen  10/03/2014      Your procedure is scheduled on Thursday, October 06, 2014 at 7:30 AM.   Report to [facility] Entrance "A" Admitting Office at 5:30 AM.   Call this number if you have problems the morning of surgery: [phone]   Any questions prior to day of surgery, please call [phone] between 8 & 4 PM.    Remember:  Do not eat food or drink liquids after midnight Wednesday, 10/05/14.  Take these medicines the morning of surgery with A SIP OF WATER:    Do not wear jewelry.  Do not wear lotions, powders, or cologne.  You may NOT wear deodorant.  Men may shave face and neck.  Do not bring valuables to the hospital.  [facility] is not responsible for any belongings or valuables.  Contacts, dentures or bridgework may not be worn into surgery.  Leave your suitcase in the car.  After surgery it may be brought to your room.  For patients admitted to the hospital, discharge time will be determined by your treatment team.  Special instructions:  See "Preparing for Surgery" Instruction sheet.    Please read over the following fact sheets that you were given. Pain Booklet, Coughing and Deep Breathing, MRSA Information and Surgical Site Infection Prevention

## 2014-10-05 MED ORDER — CEFAZOLIN SODIUM-DEXTROSE 2-3 GM-% IV SOLR
2.0000 g | INTRAVENOUS | Status: AC
  Administered 2014-10-06: 2 g via INTRAVENOUS
  Filled 2014-10-05: qty 50

## 2014-10-06 ENCOUNTER — Encounter (HOSPITAL_COMMUNITY)
Admission: RE | Disposition: A | Discharge status: Home / Self Care | Payer: Self-pay | Source: Ambulatory Visit | Attending: Orthopedic Surgery

## 2014-10-06 ENCOUNTER — Encounter (HOSPITAL_COMMUNITY): Payer: Self-pay | Admitting: Certified Registered Nurse Anesthetist

## 2014-10-06 ENCOUNTER — Inpatient Hospital Stay (HOSPITAL_COMMUNITY): Payer: PRIVATE HEALTH INSURANCE

## 2014-10-06 ENCOUNTER — Inpatient Hospital Stay (HOSPITAL_COMMUNITY): Payer: PRIVATE HEALTH INSURANCE | Admitting: Certified Registered Nurse Anesthetist

## 2014-10-06 ENCOUNTER — Inpatient Hospital Stay (HOSPITAL_COMMUNITY)
Admission: RE | Admit: 2014-10-06 | Discharge: 2014-10-07 | DRG: 483 | Disposition: A | Discharge status: Home / Self Care | Payer: PRIVATE HEALTH INSURANCE | Source: Ambulatory Visit | Attending: Orthopedic Surgery | Admitting: Orthopedic Surgery

## 2014-10-06 DIAGNOSIS — M87112 Osteonecrosis due to drugs, left shoulder: Principal | ICD-10-CM | POA: Diagnosis present

## 2014-10-06 DIAGNOSIS — Z96641 Presence of right artificial hip joint: Secondary | ICD-10-CM | POA: Diagnosis present

## 2014-10-06 DIAGNOSIS — G4730 Sleep apnea, unspecified: Secondary | ICD-10-CM | POA: Diagnosis present

## 2014-10-06 DIAGNOSIS — Z888 Allergy status to other drugs, medicaments and biological substances status: Secondary | ICD-10-CM | POA: Diagnosis not present

## 2014-10-06 DIAGNOSIS — Z96612 Presence of left artificial shoulder joint: Secondary | ICD-10-CM

## 2014-10-06 DIAGNOSIS — M87119 Osteonecrosis due to drugs, unspecified shoulder: Secondary | ICD-10-CM | POA: Diagnosis present

## 2014-10-06 DIAGNOSIS — R110 Nausea: Secondary | ICD-10-CM | POA: Diagnosis not present

## 2014-10-06 DIAGNOSIS — Z791 Long term (current) use of non-steroidal anti-inflammatories (NSAID): Secondary | ICD-10-CM

## 2014-10-06 DIAGNOSIS — C9591 Leukemia, unspecified, in remission: Secondary | ICD-10-CM | POA: Diagnosis present

## 2014-10-06 DIAGNOSIS — Z79899 Other long term (current) drug therapy: Secondary | ICD-10-CM

## 2014-10-06 DIAGNOSIS — T380X5A Adverse effect of glucocorticoids and synthetic analogues, initial encounter: Secondary | ICD-10-CM | POA: Diagnosis present

## 2014-10-06 HISTORY — PX: SHOULDER HEMI-ARTHROPLASTY: SHX5049

## 2014-10-06 SURGERY — HEMIARTHROPLASTY, SHOULDER
Anesthesia: Choice | Site: Shoulder | Laterality: Left

## 2014-10-06 MED ORDER — ARTIFICIAL TEARS OP OINT
TOPICAL_OINTMENT | OPHTHALMIC | Status: AC
  Filled 2014-10-06: qty 3.5

## 2014-10-06 MED ORDER — PHENYLEPHRINE 40 MCG/ML (10ML) SYRINGE FOR IV PUSH (FOR BLOOD PRESSURE SUPPORT)
PREFILLED_SYRINGE | INTRAVENOUS | Status: AC
  Filled 2014-10-06: qty 10

## 2014-10-06 MED ORDER — AMPHETAMINE-DEXTROAMPHETAMINE 10 MG PO TABS: 10.0000 mg | ORAL_TABLET | Freq: Every day | ORAL | Status: DC

## 2014-10-06 MED ORDER — MIDAZOLAM HCL 5 MG/5ML IJ SOLN
INTRAMUSCULAR | Status: DC | PRN
Start: 2014-10-06 — End: 2014-10-06
  Administered 2014-10-06 (×2): 2 mg via INTRAVENOUS

## 2014-10-06 MED ORDER — ONDANSETRON HCL 4 MG PO TABS
4.0000 mg | ORAL_TABLET | Freq: Four times a day (QID) | ORAL | Status: DC | PRN
  Filled 2014-10-06: qty 1

## 2014-10-06 MED ORDER — ZOLPIDEM TARTRATE 5 MG PO TABS: 10.0000 mg | ORAL_TABLET | Freq: Once | ORAL | Status: DC

## 2014-10-06 MED ORDER — ONDANSETRON HCL 4 MG/2ML IJ SOLN
INTRAMUSCULAR | Status: DC | PRN
  Administered 2014-10-06: 4 mg via INTRAVENOUS

## 2014-10-06 MED ORDER — POLYETHYLENE GLYCOL 3350 17 G PO PACK: 17.0000 g | PACK | Freq: Every day | ORAL | Status: DC | PRN

## 2014-10-06 MED ORDER — MUPIROCIN 2 % EX OINT
TOPICAL_OINTMENT | Freq: Once | CUTANEOUS | Status: AC
  Administered 2014-10-06: 1 via NASAL
  Filled 2014-10-06: qty 22

## 2014-10-06 MED ORDER — DIPHENHYDRAMINE HCL 50 MG/ML IJ SOLN
INTRAMUSCULAR | Status: DC | PRN
  Administered 2014-10-06: 25 mg via INTRAVENOUS

## 2014-10-06 MED ORDER — ONDANSETRON HCL 4 MG/2ML IJ SOLN
INTRAMUSCULAR | Status: AC
  Filled 2014-10-06: qty 2

## 2014-10-06 MED ORDER — MIDAZOLAM HCL 2 MG/2ML IJ SOLN
INTRAMUSCULAR | Status: AC
  Filled 2014-10-06: qty 2

## 2014-10-06 MED ORDER — LIDOCAINE HCL (CARDIAC) 20 MG/ML IV SOLN
INTRAVENOUS | Status: AC
  Filled 2014-10-06: qty 5

## 2014-10-06 MED ORDER — PHENYLEPHRINE HCL 10 MG/ML IJ SOLN
INTRAMUSCULAR | Status: DC | PRN
  Administered 2014-10-06 (×5): 40 ug via INTRAVENOUS

## 2014-10-06 MED ORDER — ROCURONIUM BROMIDE 100 MG/10ML IV SOLN
INTRAVENOUS | Status: DC | PRN
Start: 2014-10-06 — End: 2014-10-06
  Administered 2014-10-06: 40 mg via INTRAVENOUS

## 2014-10-06 MED ORDER — POVIDONE-IODINE 7.5 % EX SOLN
Freq: Once | CUTANEOUS | Status: DC
  Filled 2014-10-06: qty 118

## 2014-10-06 MED ORDER — SODIUM CHLORIDE 0.9 % IV SOLN
INTRAVENOUS | Status: DC
  Administered 2014-10-06 – 2014-10-07 (×3): via INTRAVENOUS

## 2014-10-06 MED ORDER — CEFAZOLIN SODIUM 1-5 GM-% IV SOLN
1.0000 g | Freq: Four times a day (QID) | INTRAVENOUS | Status: AC
  Administered 2014-10-06 – 2014-10-07 (×3): 1 g via INTRAVENOUS
  Filled 2014-10-06 (×3): qty 50

## 2014-10-06 MED ORDER — DIPHENHYDRAMINE HCL 12.5 MG/5ML PO ELIX: 12.5000 mg | ORAL_SOLUTION | ORAL | Status: DC | PRN

## 2014-10-06 MED ORDER — PROPOFOL 10 MG/ML IV BOLUS
INTRAVENOUS | Status: DC | PRN
  Administered 2014-10-06: 20 mg via INTRAVENOUS
  Administered 2014-10-06: 100 mg via INTRAVENOUS
  Administered 2014-10-06: 20 mg via INTRAVENOUS
  Administered 2014-10-06: 200 mg via INTRAVENOUS

## 2014-10-06 MED ORDER — ROCURONIUM BROMIDE 50 MG/5ML IV SOLN
INTRAVENOUS | Status: AC
  Filled 2014-10-06: qty 1

## 2014-10-06 MED ORDER — BISACODYL 5 MG PO TBEC: 5.0000 mg | DELAYED_RELEASE_TABLET | Freq: Every day | ORAL | Status: DC | PRN

## 2014-10-06 MED ORDER — MUPIROCIN 2 % EX OINT
TOPICAL_OINTMENT | CUTANEOUS | Status: AC
  Filled 2014-10-06: qty 22

## 2014-10-06 MED ORDER — LACTATED RINGERS IV SOLN
INTRAVENOUS | Status: DC | PRN
  Administered 2014-10-06 (×2): via INTRAVENOUS

## 2014-10-06 MED ORDER — ACETAMINOPHEN 650 MG RE SUPP: 650.0000 mg | Freq: Four times a day (QID) | RECTAL | Status: DC | PRN

## 2014-10-06 MED ORDER — HYDROMORPHONE HCL 1 MG/ML IJ SOLN
0.2500 mg | INTRAMUSCULAR | Status: DC | PRN
  Administered 2014-10-06: 0.5 mg via INTRAVENOUS

## 2014-10-06 MED ORDER — MORPHINE SULFATE 2 MG/ML IJ SOLN
2.0000 mg | INTRAMUSCULAR | Status: DC | PRN
  Administered 2014-10-06 – 2014-10-07 (×5): 2 mg via INTRAVENOUS
  Filled 2014-10-06 (×4): qty 1

## 2014-10-06 MED ORDER — PROMETHAZINE HCL 25 MG/ML IJ SOLN: 6.2500 mg | INTRAMUSCULAR | Status: DC | PRN

## 2014-10-06 MED ORDER — ONDANSETRON HCL 4 MG/2ML IJ SOLN
4.0000 mg | Freq: Four times a day (QID) | INTRAMUSCULAR | Status: DC | PRN
  Administered 2014-10-07: 4 mg via INTRAVENOUS
  Filled 2014-10-06 (×2): qty 2

## 2014-10-06 MED ORDER — MIDAZOLAM HCL 2 MG/2ML IJ SOLN: 0.5000 mg | Freq: Once | INTRAMUSCULAR | Status: DC | PRN

## 2014-10-06 MED ORDER — ACETAMINOPHEN 325 MG PO TABS
650.0000 mg | ORAL_TABLET | Freq: Four times a day (QID) | ORAL | Status: DC | PRN
  Administered 2014-10-07: 650 mg via ORAL
  Filled 2014-10-06 (×2): qty 2

## 2014-10-06 MED ORDER — PROPOFOL 10 MG/ML IV BOLUS
INTRAVENOUS | Status: AC
  Filled 2014-10-06: qty 20

## 2014-10-06 MED ORDER — PHENOL 1.4 % MT LIQD
1.0000 | OROMUCOSAL | Status: DC | PRN
  Administered 2014-10-06: 1 via OROMUCOSAL
  Filled 2014-10-06: qty 177

## 2014-10-06 MED ORDER — GLYCOPYRROLATE 0.2 MG/ML IJ SOLN
INTRAMUSCULAR | Status: AC
  Filled 2014-10-06: qty 2

## 2014-10-06 MED ORDER — BUPIVACAINE-EPINEPHRINE (PF) 0.5% -1:200000 IJ SOLN
INTRAMUSCULAR | Status: DC | PRN
  Administered 2014-10-06: 30 mL via PERINEURAL

## 2014-10-06 MED ORDER — MEPERIDINE HCL 25 MG/ML IJ SOLN: 6.2500 mg | INTRAMUSCULAR | Status: DC | PRN

## 2014-10-06 MED ORDER — HYDROMORPHONE HCL 1 MG/ML IJ SOLN
INTRAMUSCULAR | Status: AC
  Filled 2014-10-06: qty 1

## 2014-10-06 MED ORDER — OXYCODONE-ACETAMINOPHEN 5-325 MG PO TABS: 1.0000 | ORAL_TABLET | ORAL | Status: AC | PRN

## 2014-10-06 MED ORDER — DOCUSATE SODIUM 100 MG PO CAPS
100.0000 mg | ORAL_CAPSULE | Freq: Two times a day (BID) | ORAL | Status: DC
  Administered 2014-10-06: 100 mg via ORAL
  Filled 2014-10-06: qty 1

## 2014-10-06 MED ORDER — ACETAMINOPHEN 500 MG PO TABS
1000.0000 mg | ORAL_TABLET | Freq: Four times a day (QID) | ORAL | Status: AC
  Administered 2014-10-06 – 2014-10-07 (×4): 1000 mg via ORAL
  Filled 2014-10-06 (×4): qty 2

## 2014-10-06 MED ORDER — ZOLPIDEM TARTRATE 5 MG PO TABS
10.0000 mg | ORAL_TABLET | Freq: Once | ORAL | Status: AC
  Administered 2014-10-06: 10 mg via ORAL
  Filled 2014-10-06 (×2): qty 2

## 2014-10-06 MED ORDER — GLYCOPYRROLATE 0.2 MG/ML IJ SOLN
INTRAMUSCULAR | Status: DC | PRN
  Administered 2014-10-06: 0.4 mg via INTRAVENOUS
  Administered 2014-10-06: 0.2 mg via INTRAVENOUS

## 2014-10-06 MED ORDER — SODIUM CHLORIDE 0.9 % IR SOLN
Status: DC | PRN
  Administered 2014-10-06: 3000 mL

## 2014-10-06 MED ORDER — OXYCODONE HCL 5 MG PO TABS
5.0000 mg | ORAL_TABLET | ORAL | Status: DC | PRN
  Administered 2014-10-06: 5 mg via ORAL
  Administered 2014-10-06 – 2014-10-07 (×5): 10 mg via ORAL
  Filled 2014-10-06: qty 1
  Filled 2014-10-06 (×6): qty 2

## 2014-10-06 MED ORDER — ASPIRIN EC 325 MG PO TBEC
325.0000 mg | DELAYED_RELEASE_TABLET | Freq: Two times a day (BID) | ORAL | Status: DC
  Administered 2014-10-06 – 2014-10-07 (×2): 325 mg via ORAL
  Filled 2014-10-06 (×2): qty 1

## 2014-10-06 MED ORDER — DOCUSATE SODIUM 100 MG PO CAPS: 100.0000 mg | ORAL_CAPSULE | Freq: Three times a day (TID) | ORAL | Status: AC | PRN

## 2014-10-06 MED ORDER — SODIUM CHLORIDE 0.9 % IR SOLN
Status: DC | PRN
  Administered 2014-10-06: 1000 mL

## 2014-10-06 MED ORDER — LIDOCAINE HCL (CARDIAC) 20 MG/ML IV SOLN
INTRAVENOUS | Status: DC | PRN
  Administered 2014-10-06: 60 mg via INTRATRACHEAL
  Administered 2014-10-06: 20 mg via INTRAVENOUS

## 2014-10-06 MED ORDER — NEOSTIGMINE METHYLSULFATE 10 MG/10ML IV SOLN
INTRAVENOUS | Status: DC | PRN
  Administered 2014-10-06: 3 mg via INTRAVENOUS

## 2014-10-06 MED ORDER — FENTANYL CITRATE (PF) 100 MCG/2ML IJ SOLN
INTRAMUSCULAR | Status: DC | PRN
  Administered 2014-10-06: 100 ug via INTRAVENOUS
  Administered 2014-10-06: 50 ug via INTRAVENOUS

## 2014-10-06 MED ORDER — DIPHENHYDRAMINE HCL 50 MG/ML IJ SOLN
INTRAMUSCULAR | Status: AC
  Filled 2014-10-06: qty 1

## 2014-10-06 MED ORDER — MENTHOL 3 MG MT LOZG
1.0000 | LOZENGE | OROMUCOSAL | Status: DC | PRN
Start: 2014-10-06 — End: 2014-10-07
  Administered 2014-10-06: 3 mg via ORAL
  Filled 2014-10-06: qty 9

## 2014-10-06 MED ORDER — NEOSTIGMINE METHYLSULFATE 10 MG/10ML IV SOLN
INTRAVENOUS | Status: AC
  Filled 2014-10-06: qty 1

## 2014-10-06 MED ORDER — ALUM & MAG HYDROXIDE-SIMETH 200-200-20 MG/5ML PO SUSP: 30.0000 mL | ORAL | Status: DC | PRN

## 2014-10-06 MED ORDER — FENTANYL CITRATE (PF) 250 MCG/5ML IJ SOLN
INTRAMUSCULAR | Status: AC
  Filled 2014-10-06: qty 5

## 2014-10-06 MED ORDER — FLEET ENEMA 7-19 GM/118ML RE ENEM: 1.0000 | ENEMA | Freq: Once | RECTAL | Status: AC | PRN

## 2014-10-06 SURGICAL SUPPLY — 68 items
BLADE SAW SAG 73X25 THK (BLADE)
BLADE SAW SGTL 73X25 THK (BLADE) IMPLANT
BLADE SURG 15 STRL LF DISP TIS (BLADE) ×2 IMPLANT
BLADE SURG 15 STRL SS (BLADE)
BOWL SMART MIX CTS (DISPOSABLE) IMPLANT
CAP SHLDR PARTIAL 2 ×2 IMPLANT
CHLORAPREP W/TINT 26ML (MISCELLANEOUS) ×4 IMPLANT
CLOSURE WOUND 1/2 X4 (GAUZE/BANDAGES/DRESSINGS) ×1
COVER SURGICAL LIGHT HANDLE (MISCELLANEOUS) ×3 IMPLANT
DRAPE IMP U-DRAPE 54X76 (DRAPES) ×6 IMPLANT
DRAPE INCISE IOBAN 66X45 STRL (DRAPES) ×3 IMPLANT
DRAPE ORTHO SPLIT 77X108 STRL (DRAPES) ×6
DRAPE SURG 17X23 STRL (DRAPES) ×3 IMPLANT
DRAPE SURG ORHT 6 SPLT 77X108 (DRAPES) ×2 IMPLANT
DRAPE U-SHAPE 47X51 STRL (DRAPES) ×3 IMPLANT
DRSG AQUACEL AG ADV 3.5X10 (GAUZE/BANDAGES/DRESSINGS) ×2 IMPLANT
DRSG MEPILEX BORDER 4X4 (GAUZE/BANDAGES/DRESSINGS) ×1 IMPLANT
DRSG MEPILEX BORDER 4X8 (GAUZE/BANDAGES/DRESSINGS) ×1 IMPLANT
DRSG PAD ABDOMINAL 8X10 ST (GAUZE/BANDAGES/DRESSINGS) IMPLANT
ELECT BLADE 4.0 EZ CLEAN MEGAD (MISCELLANEOUS) ×3
ELECT CAUTERY BLADE 6.4 (BLADE) ×3 IMPLANT
ELECT REM PT RETURN 9FT ADLT (ELECTROSURGICAL) ×3
ELECTRODE BLDE 4.0 EZ CLN MEGD (MISCELLANEOUS) IMPLANT
ELECTRODE REM PT RTRN 9FT ADLT (ELECTROSURGICAL) ×1 IMPLANT
EVACUATOR 1/8 PVC DRAIN (DRAIN) ×1 IMPLANT
GAUZE SPONGE 4X4 12PLY STRL (GAUZE/BANDAGES/DRESSINGS) ×1 IMPLANT
GLOVE BIO SURGEON STRL SZ7 (GLOVE) ×3 IMPLANT
GLOVE BIO SURGEON STRL SZ7.5 (GLOVE) ×3 IMPLANT
GLOVE BIOGEL PI IND STRL 8 (GLOVE) ×1 IMPLANT
GLOVE BIOGEL PI INDICATOR 8 (GLOVE) ×2
GOWN STRL REUS W/ TWL LRG LVL3 (GOWN DISPOSABLE) ×3 IMPLANT
GOWN STRL REUS W/TWL LRG LVL3 (GOWN DISPOSABLE) ×9
HANDPIECE INTERPULSE COAX TIP (DISPOSABLE) ×3
HEMOSTAT SURGICEL 2X14 (HEMOSTASIS) IMPLANT
HOOD PEEL AWAY FACE SHEILD DIS (HOOD) ×6 IMPLANT
KIT BASIN OR (CUSTOM PROCEDURE TRAY) ×3 IMPLANT
KIT ROOM TURNOVER OR (KITS) ×3 IMPLANT
MANIFOLD NEPTUNE II (INSTRUMENTS) ×3 IMPLANT
MARKER SKIN DUAL TIP RULER LAB (MISCELLANEOUS) ×2 IMPLANT
NDL HYPO 25GX1X1/2 BEV (NEEDLE) IMPLANT
NDL MAYO TROCAR (NEEDLE) ×1 IMPLANT
NEEDLE HYPO 25GX1X1/2 BEV (NEEDLE) IMPLANT
NEEDLE MAYO TROCAR (NEEDLE) ×3 IMPLANT
NS IRRIG 1000ML POUR BTL (IV SOLUTION) ×3 IMPLANT
PACK SHOULDER (CUSTOM PROCEDURE TRAY) ×3 IMPLANT
PACK UNIVERSAL I (CUSTOM PROCEDURE TRAY) ×1 IMPLANT
PAD ARMBOARD 7.5X6 YLW CONV (MISCELLANEOUS) ×6 IMPLANT
RETRIEVER SUT HEWSON (MISCELLANEOUS) ×4 IMPLANT
SET HNDPC FAN SPRY TIP SCT (DISPOSABLE) ×1 IMPLANT
SLING ARM IMMOBILIZER LRG (SOFTGOODS) ×3 IMPLANT
SLING ARM IMMOBILIZER MED (SOFTGOODS) IMPLANT
SPONGE LAP 18X18 X RAY DECT (DISPOSABLE) ×1 IMPLANT
STRIP CLOSURE SKIN 1/2X4 (GAUZE/BANDAGES/DRESSINGS) ×2 IMPLANT
SUCTION FRAZIER TIP 10 FR DISP (SUCTIONS) ×3 IMPLANT
SUPPORT WRAP ARM LG (MISCELLANEOUS) ×3 IMPLANT
SUT ETHIBOND NAB CT1 #1 30IN (SUTURE) ×10 IMPLANT
SUT FIBERWIRE #2 38 T-5 BLUE (SUTURE) ×6
SUT MNCRL AB 4-0 PS2 18 (SUTURE) ×3 IMPLANT
SUT VIC AB 0 CTB1 27 (SUTURE) ×4 IMPLANT
SUT VIC AB 2-0 CT1 27 (SUTURE) ×6
SUT VIC AB 2-0 CT1 TAPERPNT 27 (SUTURE) ×2 IMPLANT
SUTURE FIBERWR #2 38 T-5 BLUE (SUTURE) ×2 IMPLANT
SYR CONTROL 10ML LL (SYRINGE) IMPLANT
TAPE FIBER 2MM 7IN #2 BLUE (SUTURE) ×3 IMPLANT
TOWEL OR 17X24 6PK STRL BLUE (TOWEL DISPOSABLE) ×3 IMPLANT
TOWEL OR 17X26 10 PK STRL BLUE (TOWEL DISPOSABLE) ×3 IMPLANT
TRAY FOLEY CATH 16FRSI W/METER (SET/KITS/TRAYS/PACK) IMPLANT
WATER STERILE IRR 1000ML POUR (IV SOLUTION) ×1 IMPLANT

## 2014-10-06 NOTE — Anesthesia Procedure Notes (Addendum)
Anesthesia Regional Block:  Interscalene brachial plexus block  Pre-Anesthetic Checklist: ,, timeout performed, Correct Patient, Correct Site, Correct Laterality, Correct Procedure, Correct Position, site marked, Risks and benefits discussed,  Surgical consent,  Pre-op evaluation,  At surgeon's request and post-op pain management  Laterality: Left and Upper  Prep: chloraprep       Needles:  Injection technique: Single-shot  Needle Type: Echogenic Stimulator Needle     Needle Length: 5cm 5 cm Needle Gauge: 22 and 22 G    Additional Needles:  Procedures: nerve stimulator Interscalene brachial plexus block  Nerve Stimulator or Paresthesia:  Response: forearm twitch, 0.45 mA, 0.1 ms,   Additional Responses:   Narrative:  Start time: 10/06/2014 7:09 AM End time: 10/06/2014 7:15 AM Injection made incrementally with aspirations every 5 mL.  Performed by: Personally  Anesthesiologist: Glennon Mac, CARSWELL  Additional Notes: Pt identified in Holding room.  Monitors applied. Working IV access confirmed. Sterile prep L neck.  #22ga PNS to forearm twitch at 0.29m threshold.  30cc 0.5% Bupivacaine with 1:200k epi injected incrementally after negative test dose.  Patient asymptomatic, VSS, no heme aspirated, tolerated well.  CJenita Seashore MD   Procedure Name: Intubation Date/Time: 10/06/2014 7:42 AM Performed by: YMaryland PinkPre-anesthesia Checklist: Patient identified, Emergency Drugs available, Suction available, Patient being monitored and Timeout performed Patient Re-evaluated:Patient Re-evaluated prior to inductionOxygen Delivery Method: Circle system utilized Preoxygenation: Pre-oxygenation with 100% oxygen Intubation Type: IV induction Ventilation: Mask ventilation without difficulty and Oral airway inserted - appropriate to patient size Laryngoscope Size: Mac and 4 Grade View: Grade I Tube type: Oral Tube size: 7.5 mm Number of attempts: 1 Airway Equipment and Method:  Stylet and LTA kit utilized Placement Confirmation: ETT inserted through vocal cords under direct vision,  positive ETCO2 and breath sounds checked- equal and bilateral Secured at: 21 cm Tube secured with: Tape Dental Injury: Teeth and Oropharynx as per pre-operative assessment

## 2014-10-06 NOTE — Op Note (Signed)
Procedure(s): SHOULDER HEMI-ARTHROPLASTY Procedure Note  Patrick Cohen male 29 y.o. 10/06/2014  Procedure(s) and Anesthesia Type:     LEFT SHOULDER HEMI-ARTHROPLASTY - Choice  Surgeon(s) and Role:    * Tania Ade, MD - Primary   Indications:  29 y.o. male  With L shoulder AVN with collapse. Pain and dysfunction interfered with quality of life and nonoperative treatment with activity modification, NSAIDS failed.     Surgeon: Nita Sells   Assistants: Jeanmarie Hubert PA-C [facility] was present and scrubbed throughout the procedure and was essential in positioning, retraction, exposure, and closure)  Anesthesia: General endotracheal anesthesia with preoperative interscalene block given by the attending anesthesiologist    Procedure Detail  SHOULDER HEMI-ARTHROPLASTY  Findings: Tornier flex anatomic press-fit size 4 stem with a 48 x 18 low offset head.   A lesser tuberosity osteotomy was performed and repaired at the conclusion of the procedure.  Estimated Blood Loss:  200 mL         Drains: None   Blood Given: none          Specimens: none        Complications:  * No complications entered in OR log *         Disposition: PACU - hemodynamically stable.         Condition: stable    Procedure:   The patient was identified in the preoperative holding area where I personally marked the operative extremity after verifying with the patient and consent. He  was taken to the operating room where He was transferred to the   operative table.  The patient received an interscalene block in   the holding area by the attending anesthesiologist.  General anesthesia was induced   in the operating room without complication.  The patient did receive IV  Ancef prior to the commencement of the procedure.  The patient was   placed in the beach-chair position with the back raised about 30   degrees.  The nonoperative extremity and head and neck were carefully   positioned and padded protecting against neurovascular compromise.  The   left upper extremity was then prepped and draped in the standard sterile   fashion.    The appropriate operative time-out was performed with   Anesthesia, the perioperative staff, as well as myself and we all agreed   that the left side was the correct operative site.  An approximately   8 cm incision was made from the tip of the coracoid to the center point of the   humerus at the level of the axilla.  Dissection was carried down sharply   through subcutaneous tissues and cephalic vein was identified and taken   laterally with the deltoid.  The pectoralis major was taken medially.  The   upper 1 cm of the pectoralis major was released from its attachment on   the humerus.  The clavipectoral fascia was incised just lateral to the   conjoined tendon.  This incision was carried up to but not into the   coracoacromial ligament.  Digital palpation was used to prove   integrity of the axillary nerve which was protected throughout the   procedure.  Musculocutaneous nerve was not palpated in the operative   field.  Conjoined tendon was then retracted gently medially and the   deltoid laterally.  Anterior circumflex humeral vessels were clamped and   coagulated.  The soft tissues overlying the biceps was incised and this   incision was carried  across the transverse humeral ligament to the base   of the coracoid.  The biceps was tenodesed to the soft tissue just above   pectoralis major and the remaining portion of the biceps superiorly was   excised.  An osteotomy was performed at the lesser tuberosity.  Capsule was then   released all the way down to the 6 o'clock position of the humeral head.   The humeral head was then delivered with simultaneous adduction,   extension and external rotation.  He was noted to have collapsed humeral head centrally with significant softening of the cartilage. All humeral osteophytes were  removed   and the anatomic neck of the humerus was marked and cut free hand at   approximately 25 degrees retroversion within about 3 mm of the cuff   reflection posteriorly.  The head size was estimated to be a 48 medium   offset.  At that point, the humeral head was retracted posteriorly with   a Fukuda retractor and the glenoid was examined. It was found be intact with intact labrum. The proximal humerus was then again exposed.    The entry awl was used followed by sounding reamers and then sequentially broached from size 2 to 4. This was then left in place and the calcar planer was used. Trial head was placed with a 48 by 18.  With the trial implantation of the component,  there was approximately 50% posterior translation with immediate snap back to the   anatomic position.  With forward elevation, there was no tendency   towards posterior subluxation.   The trial was removed and the final implant was prepared on a back table.  The trial was removed and the final implant was prepared on a back table.   3 Small holes were drilled on the medial side of the lesser tuberosity osteotomy, through which 2 #2 fiber wires were passed. The implant was then placed through the loop of the FiberWire's and impacted with an excellent press-fit. This achieved excellent anatomic reconstruction of the proximal humerus.  The joint was then copiously irrigated with pulse lavage.  The subscapularis and   lesser tuberosity osteotomy were then repaired using the #2 FiberWire's previously passed in a double row fashion tied over a bone bridge laterally..   One #1 Ethibond was placed at the rotator interval just above   the lesser tuberosity. Copious irrigation was used. Skin was closed with 2-0 Vicryl sutures in the deep dermal layer and 4-0 Monocryl in a subcuticular  running fashion.  Sterile dressings were then applied including Aquacel.  The patient was placed in a sling and allowed to awaken from general anesthesia  and taken to the recovery room in stable condition.      POSTOPERATIVE PLAN:  Early passive range of motion will be allowed with the goal of 40 degrees external rotation and 140 degrees forward elevation.  No internal rotation at this time.  No active motion of the arm until the lesser tuberosity heals.  The patient will likely be kept in the hospital for 1 days and then discharged home.

## 2014-10-06 NOTE — Discharge Instructions (Signed)
Discharge Instructions after Shoulder Hemiarthroplasty   A sling has been provided for you. Remove the sling 5 times each day to perform motion exercises. After the first 48 to 72 hours, discontinue using the sling. You should use the sling as a protective device, if you are in a crowd.  Use ice on the shoulder intermittently over the first 48 hours after surgery.  Pain medication has been prescribed for you.  Use your medication liberally over the first 48 hours, and then begin to taper your use. You may take Extra Strength Tylenol or Tylenol only in place of the pain pills. DO NOT take ANY nonsteroidal anti-inflammatory pain medications: Advil, Motrin, Ibuprofen, Aleve, Naproxen, or Naprosyn. Take one aspirin a day for 2 weeks after surgery, unless you have an aspirin sensitivity/allergy or asthma. Leave your dressing on until your first follow up visit.  You may shower with the dressing.  Hold your arm as if you still have your sling on while you shower. Active reaching and lifting are not permitted. You may use the operative arm for activities of daily living that do not require the operative arm to leave the side of the body, such as eating, drinking, bathing, etc.  Three to 5 times each day you should perform assisted overhead reaching and external rotation (outward turning) exercises with the operative arm. You were taught these exercises prior to discharge. Both exercises should be done with the non-operative arm used as the "therapist arm" while the operative arm remains relaxed. Ten of each exercise should be done three to five times each day.   Overhead reach is helping to lift your stiff arm up as high as it will go. To stretch your overhead reach, lie flat on your back, relax, and grasp the wrist of the tight shoulder with your opposite hand. Using the power in your opposite arm, bring the stiff arm up as far as it is comfortable. Start holding it for ten seconds and then work up to where  you can hold it for a count of 30. Breathe slowly and deeply while the arm is moved. Repeat this stretch ten times, trying to help the ar up a little higher each time.     External rotation is turning the arm out to the side while your elbow stays close to your body. External rotation is best stretched while you are lying on your back. Hold a cane, yardstick, broom handle, or dowel in both hands. Bend both elbows to a right angle. Use steady, gentle force from your normal arm to rotate the hand of the stiff shoulder out away from your body. Continue the rotation as far as it will go comfortably, holding it there for a count of 10. Repeat this exercise ten times.      Please call [phone] during normal business hours or [phone] after hours for any problems. Including the following:  - excessive redness of the incisions - drainage for more than 4 days - fever of more than 101.5 F  *Please note that pain medications will not be refilled after hours or on weekends.

## 2014-10-06 NOTE — H&P (Signed)
Patrick Cohen is an 29 y.o. male.   Chief Complaint: L shoulder AVN HPI: L shoulder AVN with collapse, failed conservative tx.  Past Medical History  Diagnosis Date  . 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 Sleep apnea     dont use cpap  . ADHD (attention deficit hyperactivity disorder)     Past Surgical History  Procedure Laterality Date  . Bone graft hip iliac crest      right hip  . Joint replacement      rt hip    Family History  Problem Relation Age of Onset  . Asthma Father     non smoker  . Sleep apnea Father   . Healthy Mother   . Healthy Sister    Social History:  reports that he has never smoked. He does not have any smokeless tobacco history on file. He reports that he drinks alcohol. He reports that he does not use illicit drugs.  Allergies:  Allergies  Allergen Reactions  . Asparaginase Shortness Of Breath  . Reglan [Metoclopramide]     Bee sting like reaction    Medications Prior to Admission  Medication Sig Dispense Refill  . amphetamine-dextroamphetamine (ADDERALL) 10 MG tablet Take 10 mg by mouth daily with breakfast.    . ibuprofen (ADVIL,MOTRIN) 200 MG tablet Take 200 mg by mouth every 6 (six) hours as needed.    . zolpidem (AMBIEN) 10 MG tablet Take 1 tablet by mouth At bedtime as needed.      No results found for this or any previous visit (from the past 48 hour(s)). No results found.  Review of Systems  All other systems reviewed and are negative.   Blood pressure 128/80, pulse 67, temperature 97.9 F (36.6 C), temperature source Oral, resp. rate 16, weight 72.984 kg (160 lb 14.4 oz), SpO2 98 %. Physical Exam  Constitutional: He is oriented to person, place, and time. He appears well-developed and well-nourished.  HENT:  Head: Atraumatic.  Eyes: EOM are normal.  Cardiovascular: Intact distal pulses.   Respiratory: Effort normal.  Musculoskeletal:  L shoulder pain with ROM  Neurological: He is alert and oriented to person, place,  and time.  Skin: Skin is warm and dry.  Psychiatric: He has a normal mood and affect.     Assessment/Plan L shoulder AVN with collapse, failed conservative tx. Plan L hemiarthroplasty Risks / benefits of surgery discussed Consent on chart  NPO for OR Preop antibiotics   Patrick Cohen 10/06/2014, 7:27 AM

## 2014-10-06 NOTE — Transfer of Care (Signed)
Immediate Anesthesia Transfer of Care Note  Patient: Patrick Cohen  Procedure(s) Performed: Procedure(s) with comments: SHOULDER HEMI-ARTHROPLASTY (Left) - Left shoulder hemi-arthroplasty  Patient Location: PACU  Anesthesia Type:GA combined with regional for post-op pain  Level of Consciousness: sedated  Airway & Oxygen Therapy: Patient Spontanous Breathing and Patient connected to nasal cannula oxygen  Post-op Assessment: Report given to RN and Post -op Vital signs reviewed and stable  Post vital signs: Reviewed and stable  Last Vitals:  Filed Vitals:   10/06/14 0959  BP:   Pulse:   Temp: 36.3 C  Resp:     Complications: No apparent anesthesia complications

## 2014-10-06 NOTE — Anesthesia Preprocedure Evaluation (Addendum)
Anesthesia Evaluation  Patient identified by MRN, date of birth, ID band Patient awake    Reviewed: Allergy & Precautions, NPO status , Patient's Chart, lab work & pertinent test results  History of Anesthesia Complications Negative for: history of anesthetic complications  Airway Mallampati: I  TM Distance: >3 FB Neck ROM: Full    Dental  (+) Dental Advisory Given   Pulmonary sleep apnea (mild, does not need CPAP) ,  breath sounds clear to auscultation        Cardiovascular negative cardio ROS  Rhythm:Regular Rate:Normal     Neuro/Psych PSYCHIATRIC DISORDERS (ADHD) negative neurological ROS     GI/Hepatic negative GI ROS, Neg liver ROS,   Endo/Other  negative endocrine ROS  Renal/GU negative Renal ROS     Musculoskeletal  (+) Arthritis - (avascular necrosis),   Abdominal   Peds  Hematology H/o leukemia- ALL   Anesthesia Other Findings   Reproductive/Obstetrics                           Anesthesia Physical Anesthesia Plan  ASA: III  Anesthesia Plan: General   Post-op Pain Management:    Induction: Intravenous  Airway Management Planned: Oral ETT  Additional Equipment:   Intra-op Plan:   Post-operative Plan: Extubation in OR  Informed Consent: I have reviewed the patients History and Physical, chart, labs and discussed the procedure including the risks, benefits and alternatives for the proposed anesthesia with the patient or authorized representative who has indicated his/her understanding and acceptance.   Dental advisory given  Plan Discussed with: CRNA and Surgeon  Anesthesia Plan Comments: (Plan routine monitors, GETA with Interscalene block for post-op analgesia)        Anesthesia Quick Evaluation

## 2014-10-06 NOTE — Anesthesia Postprocedure Evaluation (Signed)
  Anesthesia Post-op Note  Patient: Patrick Cohen  Procedure(s) Performed: Procedure(s) with comments: SHOULDER HEMI-ARTHROPLASTY (Left) - Left shoulder hemi-arthroplasty  Patient Location: PACU  Anesthesia Type:GA combined with regional for post-op pain  Level of Consciousness: awake, alert , oriented and patient cooperative  Airway and Oxygen Therapy: Patient Spontanous Breathing  Post-op Pain: none  Post-op Assessment: Post-op Vital signs reviewed, Patient's Cardiovascular Status Stable, Respiratory Function Stable, Patent Airway, No signs of Nausea or vomiting and Pain level controlled              Post-op Vital Signs: Reviewed and stable  Last Vitals:  Filed Vitals:   10/06/14 1038  BP:   Pulse: 93  Temp: 36.5 C  Resp: 15    Complications: No apparent anesthesia complications

## 2014-10-07 ENCOUNTER — Encounter (HOSPITAL_COMMUNITY): Payer: Self-pay | Admitting: Orthopedic Surgery

## 2014-10-07 LAB — BASIC METABOLIC PANEL
Anion gap: 6 (ref 5–15)
BUN: 8 mg/dL (ref 6–20)
CALCIUM: 8 mg/dL — AB (ref 8.9–10.3)
CO2: 24 mmol/L (ref 22–32)
CREATININE: 0.8 mg/dL (ref 0.61–1.24)
Chloride: 107 mmol/L (ref 101–111)
GFR calc non Af Amer: >60 mL/min (ref >60)
Glucose, Bld: 111 mg/dL — ABNORMAL HIGH (ref 65–99)
POTASSIUM: 3.7 mmol/L (ref 3.5–5.1)
Sodium: 137 mmol/L (ref 135–145)

## 2014-10-07 LAB — CBC
HEMATOCRIT: 36.4 % — AB (ref 39.0–52.0)
HEMOGLOBIN: 13 g/dL (ref 13.0–17.0)
MCH: 31.7 pg (ref 26.0–34.0)
MCHC: 35.7 g/dL (ref 30.0–36.0)
MCV: 88.8 fL (ref 78.0–100.0)
Platelets: 143 10*3/uL — ABNORMAL LOW (ref 150–400)
RBC: 4.1 MIL/uL — ABNORMAL LOW (ref 4.22–5.81)
RDW: 12.4 % (ref 11.5–15.5)
WBC: 8.3 10*3/uL (ref 4.0–10.5)

## 2014-10-07 MED ORDER — METHOCARBAMOL 1000 MG/10ML IJ SOLN
500.0000 mg | Freq: Four times a day (QID) | INTRAVENOUS | Status: DC | PRN
  Administered 2014-10-07: 500 mg via INTRAVENOUS
  Filled 2014-10-07 (×2): qty 5

## 2014-10-07 MED ORDER — KETOROLAC TROMETHAMINE 30 MG/ML IJ SOLN
30.0000 mg | Freq: Four times a day (QID) | INTRAMUSCULAR | Status: DC | PRN
  Administered 2014-10-07 (×2): 30 mg via INTRAVENOUS
  Filled 2014-10-07 (×2): qty 1

## 2014-10-07 MED ORDER — HYDROMORPHONE HCL 1 MG/ML IJ SOLN
1.0000 mg | INTRAMUSCULAR | Status: DC | PRN
  Administered 2014-10-07: 2 mg via INTRAVENOUS
  Filled 2014-10-07: qty 2

## 2014-10-07 MED ORDER — METHOCARBAMOL 500 MG PO TABS
500.0000 mg | ORAL_TABLET | Freq: Four times a day (QID) | ORAL | Status: DC | PRN
  Administered 2014-10-07: 500 mg via ORAL
  Filled 2014-10-07: qty 1

## 2014-10-07 MED ORDER — PROMETHAZINE HCL 12.5 MG PO TABS
12.5000 mg | ORAL_TABLET | Freq: Four times a day (QID) | ORAL | Status: AC | PRN
Start: 2014-10-07 — End: ?

## 2014-10-07 NOTE — Progress Notes (Signed)
D/C instructions and Rx reviewed with patient and his wife. All questions answered. Nursing Tech will remove IV access. Nursing Tech will take patient to [facility] tower exit once this RN has given patient pain Rx - to meet his wife to drive him home.

## 2014-10-07 NOTE — Progress Notes (Signed)
Utilization review completed. Donathan Buller, RN, BSN. 

## 2014-10-07 NOTE — Progress Notes (Signed)
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 Pt able to achieve 30 degrees FF and @ -10 ER. Written information given. Wife present for education. Pt c/o nausea. Pt plans to go home today. Pt ready to D/C when medically stable. Pt to follow up with Dr. Tamera Punt who will progress therapy as appropriate.     Follow Up Recommendations  Supervision - Intermittent;Other (comment) (follow up with Dr. Wilford Sports )    Equipment Recommendations  None recommended by OT    Recommendations for Other Services       Precautions / Restrictions Precautions Precautions: Shoulder Type of Shoulder Precautions: passive protocol. FF to 140; ER to 40 Shoulder Interventions: Shoulder sling/immobilizer Precaution Booklet Issued: Yes (comment) Restrictions Weight Bearing Restrictions: Yes LUE Weight Bearing: Non weight bearing      Mobility Bed Mobility Overal bed mobility: Modified Independent                Transfers Overall transfer level: Modified independent                    Balance Overall balance assessment: No apparent balance deficits (not formally assessed)                                          ADL Overall ADL's : Needs assistance/impaired     Grooming: Minimal assistance   Upper Body Bathing: Minimal assitance   Lower Body Bathing: Set up;Supervison/ safety   Upper Body Dressing : Moderate assistance   Lower Body Dressing: Set up   Toilet Transfer: Supervision/safety           Functional mobility during ADLs: Supervision/safety (due to feeling nauseated) General ADL Comments: Completed all education with pt/wife regarding shoulwer  protocol and compensatory techniques for ADL. Handout given. Completed ADL sessin with wife. Wife verbalized understanding.      Vision     Perception     Praxis      Pertinent Vitals/Pain Pain Assessment: 0-10 Pain Score: 6  Pain Location: L shoulder Pain Descriptors / Indicators: Aching;Burning Pain Intervention(s): Limited activity within patient's tolerance;Monitored during session;Repositioned;Ice applied     Hand Dominance Right   Extremity/Trunk Assessment Upper Extremity Assessment Upper Extremity Assessment: LUE deficits/detail LUE Deficits / Details: s/p surgery.  LUE: Unable to fully assess due to pain LUE Sensation:  (nerve block) LUE Coordination: decreased fine motor;decreased gross motor   Lower Extremity Assessment Lower Extremity Assessment:  (hx of R hip posterior hip replacement)   Cervical / Trunk Assessment Cervical / Trunk Assessment: Normal   Communication Communication Communication: No difficulties   Cognition Arousal/Alertness: Awake/alert Behavior During Therapy: WFL for tasks assessed/performed Overall Cognitive Status: Within Functional Limits for tasks assessed                     General Comments       Exercises Exercises: Shoulder     Shoulder Instructions Shoulder Instructions Donning/doffing shirt without moving shoulder: Caregiver independent with task;Patient able to independently direct caregiver Method for sponge bathing under operated UE: Caregiver independent with task;Patient able to independently direct caregiver Donning/doffing sling/immobilizer:  Caregiver independent with task;Patient able to independently direct caregiver Correct positioning of sling/immobilizer: Caregiver independent with task;Patient able to independently direct caregiver ROM for elbow, wrist and digits of operated UE: Modified independent;Caregiver independent with task Sling wearing schedule (on at all times/off for ADL's): Patient able to  independently direct caregiver Proper positioning of operated UE when showering: Independent Positioning of UE while sleeping: [place] expects to be discharged to:: Private residence Living Arrangements: Spouse/significant other Available Help at Discharge: Family;Available PRN/intermittently Type of Home: Apartment Home Access: Level entry     Home Layout: One level     Bathroom Shower/Tub: Tub/shower unit Shower/tub characteristics: Architectural technologist: Standard Bathroom Accessibility: Yes How Accessible: Accessible via walker Home Equipment: None          Prior Functioning/Environment Level of Independence: Independent             OT Diagnosis: Generalized weakness;Acute pain   OT Problem List: Decreased strength;Decreased range of motion;Decreased knowledge of precautions;Impaired UE functional use;Pain   OT Treatment/Interventions: Self-care/ADL training;Therapeutic exercise;Therapeutic activities;Patient/family education    OT Goals(Current goals can be found in the care plan section) Acute Rehab OT Goals Patient Stated Goal: to go home OT Goal Formulation: All assessment and education complete, DC therapy  OT Frequency: Min 2X/week   Barriers to D/C:            Co-evaluation              End of Session Nurse Communication: Mobility status;Precautions;Weight bearing status;Patient requests pain meds  Activity Tolerance: Patient tolerated treatment well Patient left: in bed;with call bell/phone within reach;with family/visitor present   Time: 1110-1205 OT Time Calculation (min): 55 min Charges:  OT General Charges $OT Visit: 1 Procedure OT Evaluation $Initial OT Evaluation Tier I: 1 Procedure OT Treatments $Self Care/Home Management : 23-37 mins $Therapeutic Exercise: 8-22 mins G-Codes:    Aanshi Batchelder,HILLARY November 02, 2014, 12:37 PM   [facility], OTR/L  [phone] 11-02-2014

## 2014-10-07 NOTE — Progress Notes (Signed)
   PATIENT ID: Patrick Cohen   1 Day Post-Op Procedure(s) (LRB): SHOULDER HEMI-ARTHROPLASTY (Left)  Subjective: Patient has some left shoulder pain this am 5/10. Tells me he feels like the pain is improving this am. Some nausea with the pain medication but doing well after zofran.   Objective:  Filed Vitals:   10/07/14 0500  BP: 96/51  Pulse:   Temp: 98.3 F (36.8 C)  Resp: 18     L UE dressing saturated with dried blood Dressing changed today with no discharge/active bleeding Incision benign Wiggles fingers, distally NVI Activates deltoid  Labs:   Recent Labs  10/07/14 0432  HGB 13.0   Recent Labs  10/07/14 0432  WBC 8.3  RBC 4.10*  HCT 36.4*  PLT 143*   Recent Labs  10/07/14 0432  NA 137  K 3.7  CL 107  CO2 24  BUN 8  CREATININE 0.80  GLUCOSE 111*  CALCIUM 8.0*    Assessment and Plan: 1 day s/p left shoulder hemiarthroplasty In some pain, continue pain regimen, d/c with percocet Patient requests phenergan script for nausea  OT- PROM limited to 40 ER and 140 FF Sling Fu with Dr. Tamera Punt in 2 weeks  VTE proph: ASA 325mg  BID, SCDs

## 2014-10-07 NOTE — Progress Notes (Signed)
Discussed with patient and wife pain management - if he should need IV Rx to manage pain, please inform this RN. Reassured him that his MD and PA want his pain managed prior to d/c. If pain requires Rx via IV - MD will be supportive of remaining one more night. Stated they understood. Per this RN's conversation with his OT - she also stressed this information.

## 2014-10-19 NOTE — Discharge Summary (Signed)
Patient ID: SANDER REMEDIOS MRN: [id number] DOB/AGE: 29/24/1987 29 y.o.  Admit date: 10/06/2014 Discharge date: 10/07/2014  Admission Diagnoses:  Active Problems:   Steroid-induced avascular necrosis of shoulder   Discharge Diagnoses:  Same  Past Medical History  Diagnosis Date  . 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 Sleep apnea     dont use cpap  . ADHD (attention deficit hyperactivity disorder)     Surgeries: Procedure(s): SHOULDER HEMI-ARTHROPLASTY on 10/06/2014   Consultants:    Discharged Condition: Improved  Hospital Course: Patrick Cohen is an 29 y.o. 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 Patient has severe unremitting pain that affects sleep, daily activities, and work/hobbies. After pre-op clearance the patient was taken to the operating room on 10/06/2014 and underwent  Procedure(s): SHOULDER HEMI-ARTHROPLASTY.    Patient was given perioperative antibiotics:  Anti-infectives    Start     Dose/Rate Route Frequency Ordered Stop   10/06/14 1400  ceFAZolin (ANCEF) IVPB 1 g/50 mL premix     1 g 100 mL/hr over 30 Minutes Intravenous Every 6 hours 10/06/14 1054 10/07/14 0315   10/06/14 0645  ceFAZolin (ANCEF) IVPB 2 g/50 mL premix     2 g 100 mL/hr over 30 Minutes Intravenous To [facility] 10/05/14 1347 10/06/14 0746       Patient was given sequential compression devices, early ambulation, and ASA 325mg  BID  to prevent DVT.  Patient benefited maximally from hospital stay and there were no complications.    Recent vital signs: No data found.    Recent laboratory studies: No results for input(s): WBC, HGB, HCT, PLT, NA, K, CL, CO2, BUN, CREATININE, GLUCOSE, INR, CALCIUM in the last 72 hours.  Invalid input(s): PT, 2   Discharge Medications:     Medication List    STOP taking these medications        ibuprofen 200 MG tablet  Commonly known as:   ADVIL,MOTRIN      TAKE these medications        amphetamine-dextroamphetamine 10 MG tablet  Commonly known as:  ADDERALL  Take 10 mg by mouth daily with breakfast.     docusate sodium 100 MG capsule  Commonly known as:  COLACE  Take 1 capsule (100 mg total) by mouth 3 (three) times daily as needed.     oxyCODONE-acetaminophen 5-325 MG per tablet  Commonly known as:  ROXICET  Take 1-2 tablets by mouth every 4 (four) hours as needed for severe pain.     promethazine 12.5 MG tablet  Commonly known as:  PHENERGAN  Take 1 tablet (12.5 mg total) by mouth every 6 (six) hours as needed for nausea or vomiting.     zolpidem 10 MG tablet  Commonly known as:  AMBIEN  Take 1 tablet by mouth At bedtime as needed.        Diagnostic Studies: Dg Chest 2 View  10/03/2014   CLINICAL DATA:  Preop for left shoulder hemi arthroplasty  EXAM: CHEST  2 VIEW  COMPARISON:  None.  FINDINGS: Cardiomediastinal silhouette is unremarkable. No acute infiltrate or pleural effusion. No pulmonary edema. Bony thorax is unremarkable.  IMPRESSION: No active cardiopulmonary disease.   Electronically Signed   By: Lahoma Crocker M.D.   On: 10/03/2014 10:38   Dg Shoulder Left Port  10/06/2014   CLINICAL DATA:  Postop from left shoulder hemiarthroplasty.  EXAM: LEFT SHOULDER -  1 VIEW  COMPARISON:  None.  FINDINGS: Left shoulder hemiarthroplasty is seen in expected position. No evidence of fracture or dislocation. No other bone abnormality identified.  IMPRESSION: Expected postoperative appearance of left shoulder hemiarthroplasty. No evidence of fracture or dislocation.   Electronically Signed   By: Earle Gell M.D.   On: 10/06/2014 11:47    Disposition: 01-Home or Self Care      Discharge Instructions    Call MD / Call 911    Complete by:  As directed   If you experience chest pain or shortness of breath, CALL 911 and be transported to the hospital emergency room.  If you develope a fever above 101 F, pus (white drainage)  or increased drainage or redness at the wound, or calf pain, call your surgeon's office.     Constipation Prevention    Complete by:  As directed   Drink plenty of fluids.  Prune juice may be helpful.  You may use a stool softener, such as Colace (over the counter) 100 mg twice a day.  Use MiraLax (over the counter) for constipation as needed.     Diet - low sodium heart healthy    Complete by:  As directed      Increase activity slowly as tolerated    Complete by:  As directed            Follow-up Information    Follow up with Nita Sells, MD. Schedule an appointment as soon as possible for a visit in 2 weeks.   Specialty:  Orthopedic Surgery   Contact information:   [address] [place] [phone]        Signed: Grier Mitts 10/19/2014, 8:33 AM

## 2020-02-28 ENCOUNTER — Telehealth: Payer: Self-pay | Admitting: Internal Medicine

## 2020-02-28 NOTE — Telephone Encounter (Signed)
CY - please advise. Thanks! 

## 2020-02-28 NOTE — Telephone Encounter (Signed)
I called [name] back but she leaves at 4:30.  They have left a message on her desk for her to call us back on 12/7.

## 2020-02-28 NOTE — Telephone Encounter (Signed)
Oneal Grout, DDS  Orthodontist  Augustina Mood, DDS   Both of these are certified in Sleep Dentistry for making fitted oral appliances to treat mild sleep apnea

## 2020-02-29 NOTE — Telephone Encounter (Signed)
Informed [name] of the dentist's Dr. Annamaria Boots recommended.

## 2020-07-17 ENCOUNTER — Other Ambulatory Visit: Payer: Self-pay | Admitting: Family Medicine

## 2020-07-17 ENCOUNTER — Ambulatory Visit
Admission: RE | Admit: 2020-07-17 | Discharge: 2020-07-17 | Disposition: A | Discharge status: Home / Self Care | Payer: PRIVATE HEALTH INSURANCE | Source: Ambulatory Visit | Attending: Family Medicine | Admitting: Family Medicine

## 2020-07-17 DIAGNOSIS — M549 Dorsalgia, unspecified: Secondary | ICD-10-CM

## 2022-01-17 IMAGING — CR DG THORACIC SPINE 3V
3 series · 3 of 3 positions shown · non-contrast
Comparison: None.

CLINICAL DATA: Upper back pain

EXAM:
THORACIC SPINE - 3 VIEWS

[w t-spine a.p. *]
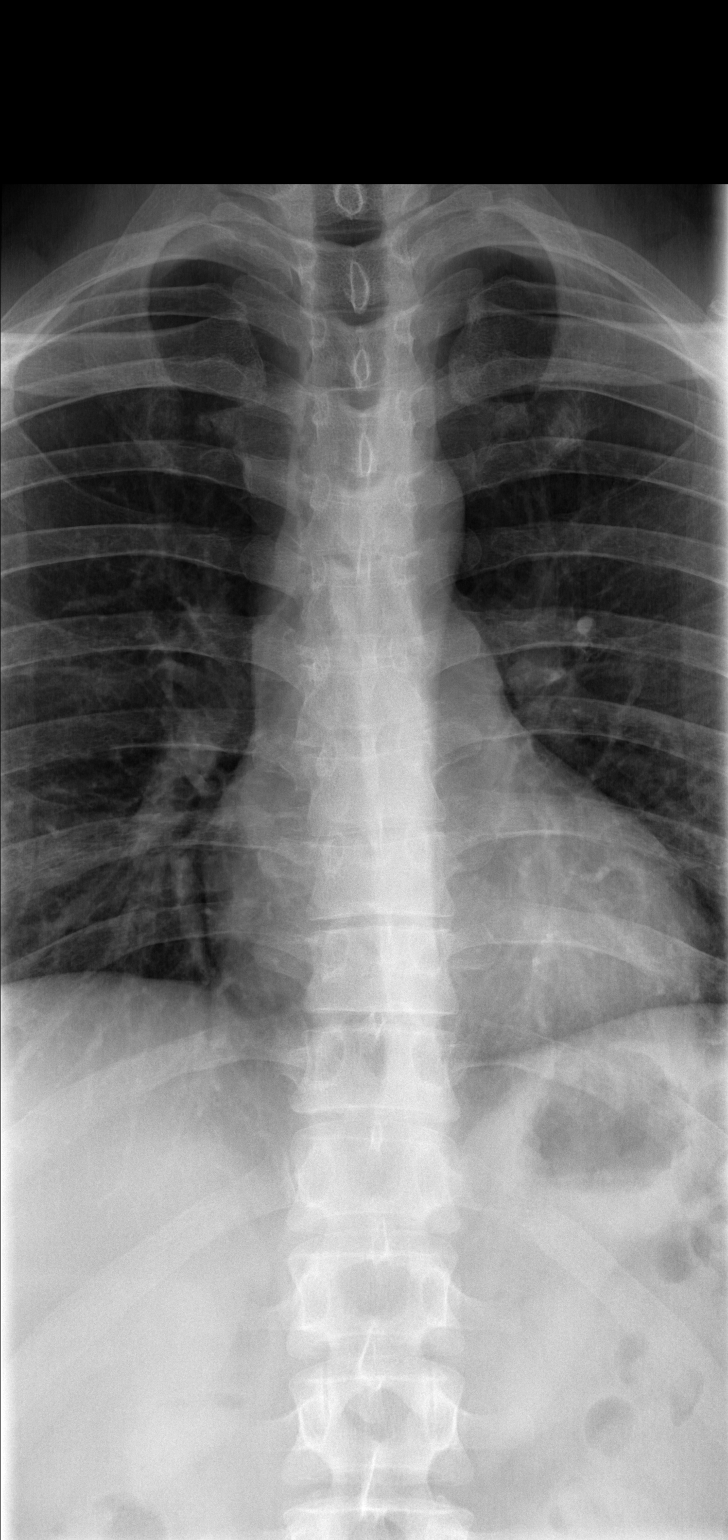

[w t-spine lat *]
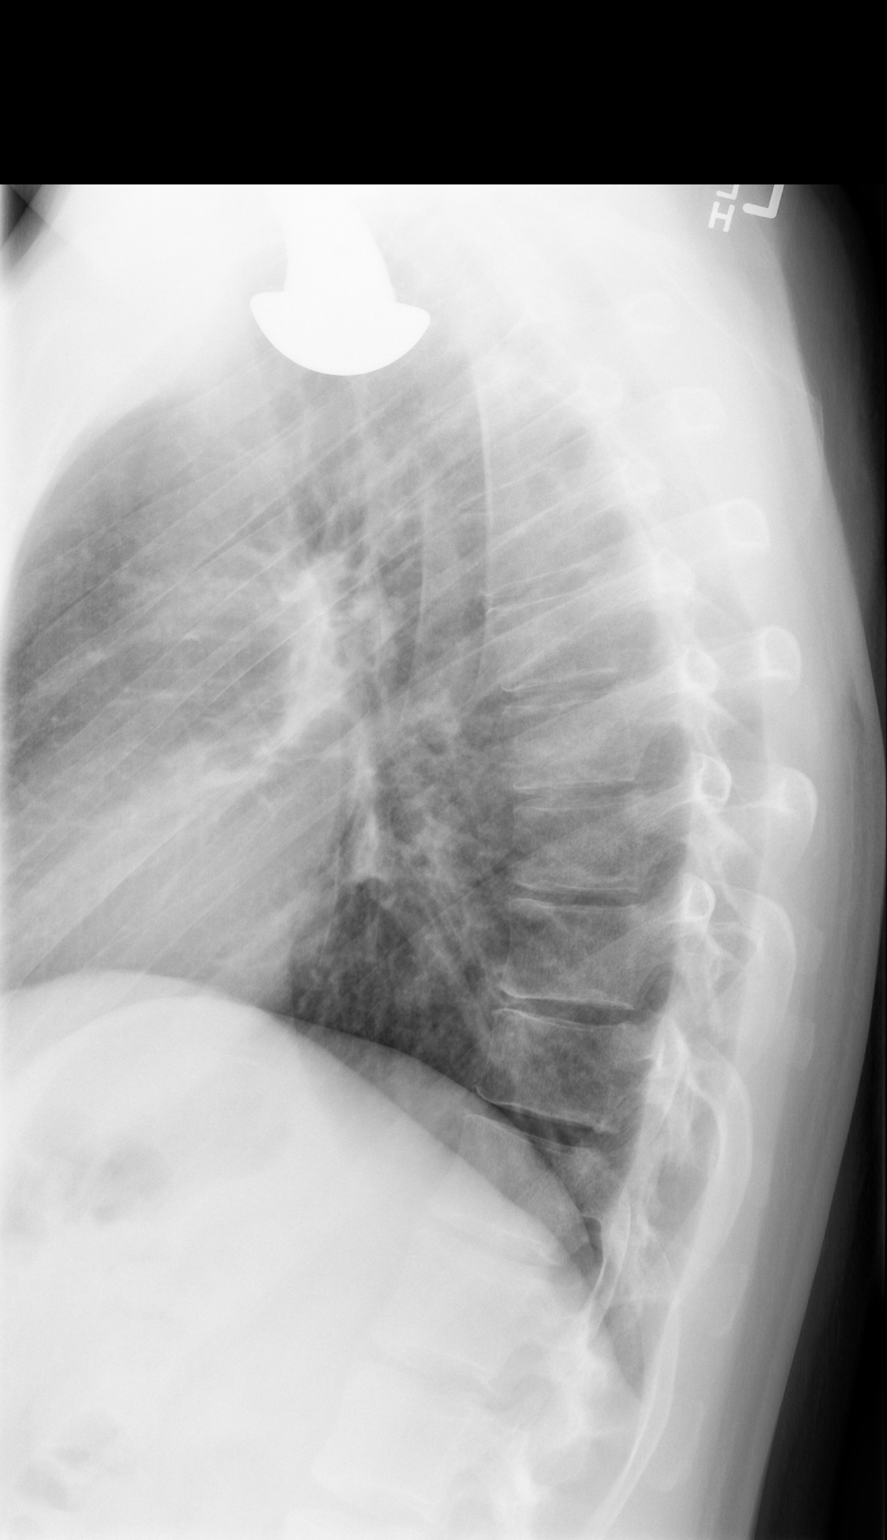

[w swimmers view *]
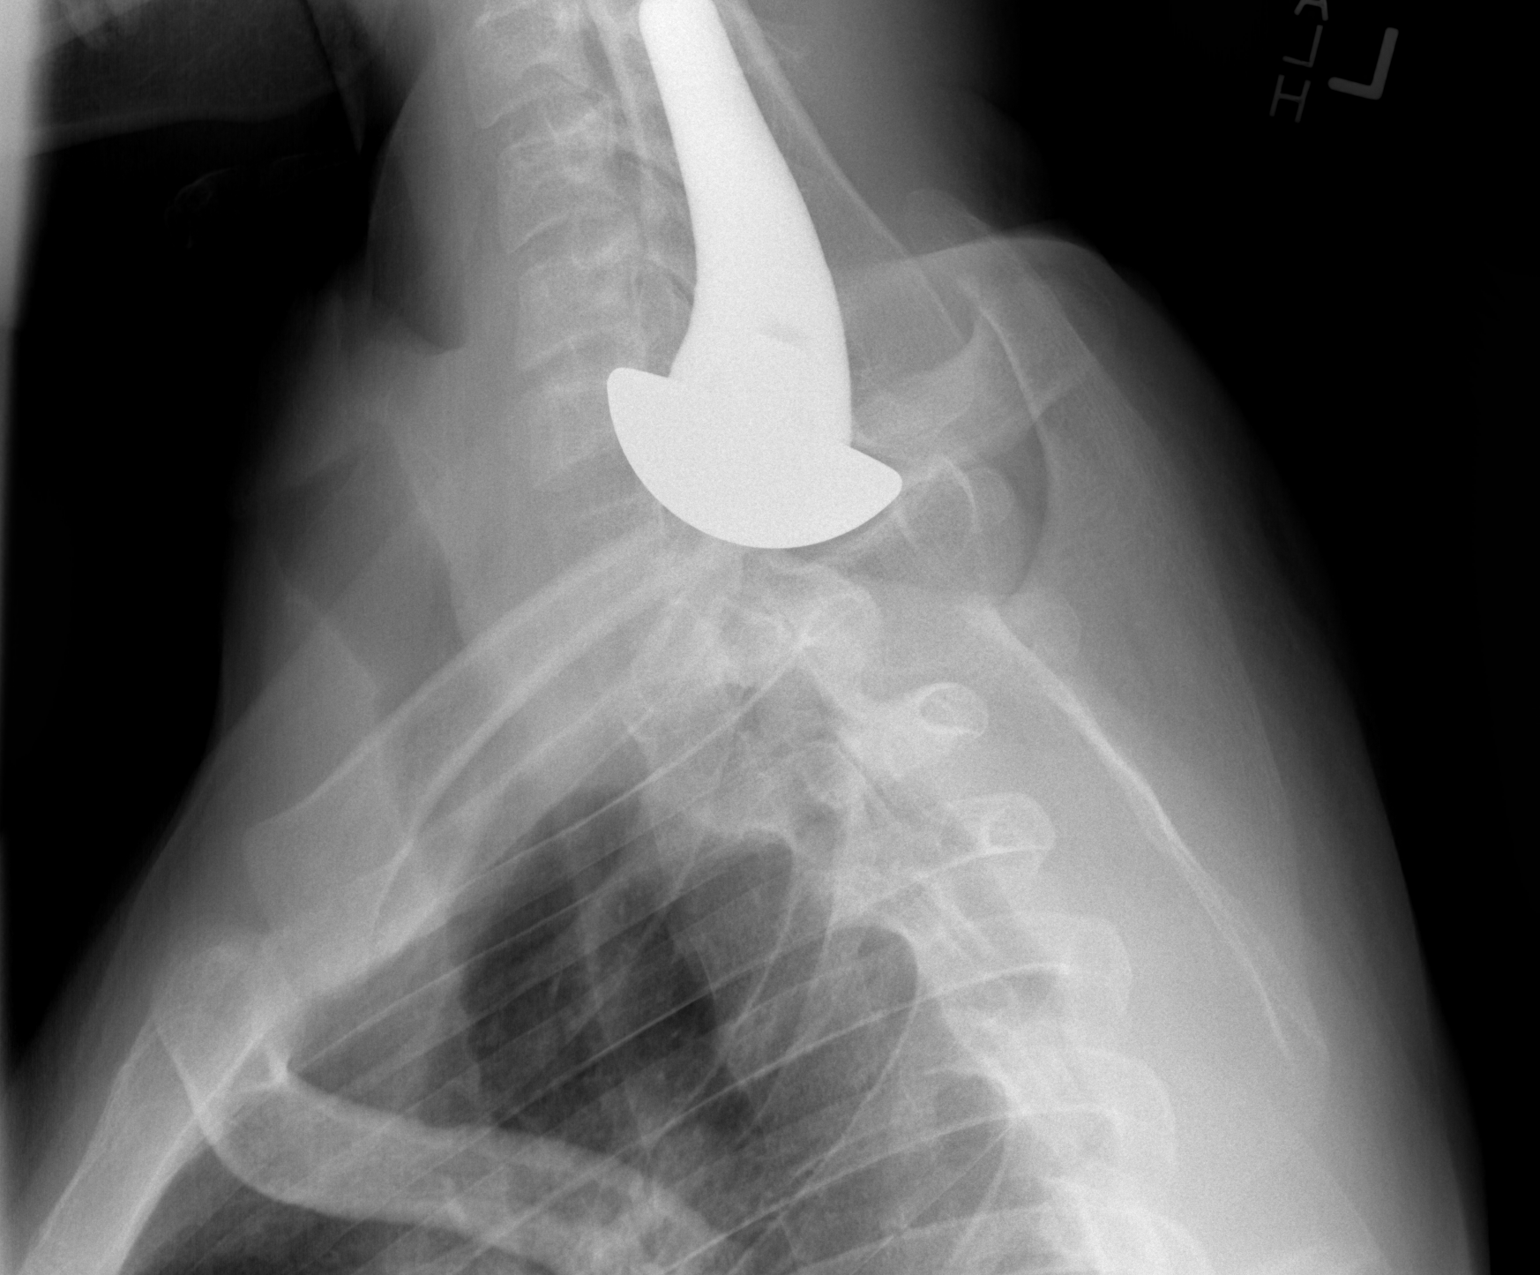

[3 of 3 positions shown; findings below may reference images not displayed]

FINDINGS: There is no evidence of thoracic spine fracture. Alignment is
normal. No other significant bone abnormalities are identified.

Left shoulder replacement
IMPRESSION: Negative.
